# Patient Record
Sex: Male | Born: 1940 | Race: White | Hispanic: No | Marital: Married | Smoking: Former smoker
Health system: Southern US, Community
[De-identification: ages and names within clinical notes are randomized; demographics above are authoritative.]

## PROBLEM LIST (undated history)

## (undated) DIAGNOSIS — I10 Essential (primary) hypertension: Secondary | ICD-10-CM

## (undated) DIAGNOSIS — IMO0001 Reserved for inherently not codable concepts without codable children: Secondary | ICD-10-CM

## (undated) DIAGNOSIS — G459 Transient cerebral ischemic attack, unspecified: Secondary | ICD-10-CM

## (undated) DIAGNOSIS — I639 Cerebral infarction, unspecified: Secondary | ICD-10-CM

## (undated) DIAGNOSIS — Z950 Presence of cardiac pacemaker: Secondary | ICD-10-CM

## (undated) DIAGNOSIS — Z95810 Presence of automatic (implantable) cardiac defibrillator: Secondary | ICD-10-CM

## (undated) DIAGNOSIS — I509 Heart failure, unspecified: Secondary | ICD-10-CM

## (undated) DIAGNOSIS — K219 Gastro-esophageal reflux disease without esophagitis: Secondary | ICD-10-CM

## (undated) DIAGNOSIS — N189 Chronic kidney disease, unspecified: Secondary | ICD-10-CM

## (undated) DIAGNOSIS — E785 Hyperlipidemia, unspecified: Secondary | ICD-10-CM

## (undated) DIAGNOSIS — I82409 Acute embolism and thrombosis of unspecified deep veins of unspecified lower extremity: Secondary | ICD-10-CM

## (undated) DIAGNOSIS — J449 Chronic obstructive pulmonary disease, unspecified: Secondary | ICD-10-CM

## (undated) DIAGNOSIS — M109 Gout, unspecified: Secondary | ICD-10-CM

## (undated) DIAGNOSIS — I2510 Atherosclerotic heart disease of native coronary artery without angina pectoris: Secondary | ICD-10-CM

## (undated) DIAGNOSIS — I739 Peripheral vascular disease, unspecified: Secondary | ICD-10-CM

## (undated) DIAGNOSIS — G4730 Sleep apnea, unspecified: Secondary | ICD-10-CM

## (undated) DIAGNOSIS — Z9289 Personal history of other medical treatment: Secondary | ICD-10-CM

## (undated) HISTORY — PX: CORONARY ARTERY BYPASS GRAFT: SHX141

## (undated) HISTORY — PX: CHOLECYSTECTOMY: SHX55

## (undated) HISTORY — PX: COLONOSCOPY W/ POLYPECTOMY: SHX1380

## (undated) HISTORY — PX: BYPASS GRAFT: SHX909

## (undated) HISTORY — PX: ABDOMINAL AORTIC ANEURYSM REPAIR: SUR1152

---

## 2005-06-05 ENCOUNTER — Ambulatory Visit: Payer: Self-pay | Admitting: Cardiology

## 2006-11-30 ENCOUNTER — Ambulatory Visit: Payer: Self-pay | Admitting: Vascular Surgery

## 2008-05-24 ENCOUNTER — Inpatient Hospital Stay (HOSPITAL_COMMUNITY): Admission: EM | Admit: 2008-05-24 | Discharge: 2008-05-28 | Payer: Self-pay | Admitting: Emergency Medicine

## 2008-05-24 ENCOUNTER — Encounter (INDEPENDENT_AMBULATORY_CARE_PROVIDER_SITE_OTHER): Payer: Self-pay | Admitting: Neurology

## 2008-05-25 ENCOUNTER — Ambulatory Visit: Payer: Self-pay | Admitting: Hematology & Oncology

## 2009-03-03 ENCOUNTER — Inpatient Hospital Stay (HOSPITAL_COMMUNITY): Admission: EM | Admit: 2009-03-03 | Discharge: 2009-03-06 | Payer: Self-pay | Admitting: Emergency Medicine

## 2009-03-03 ENCOUNTER — Encounter: Payer: Self-pay | Admitting: Emergency Medicine

## 2009-03-05 ENCOUNTER — Encounter (INDEPENDENT_AMBULATORY_CARE_PROVIDER_SITE_OTHER): Payer: Self-pay | Admitting: Internal Medicine

## 2009-03-05 ENCOUNTER — Ambulatory Visit: Payer: Self-pay | Admitting: Vascular Surgery

## 2009-07-19 ENCOUNTER — Ambulatory Visit: Payer: Self-pay | Admitting: Cardiology

## 2009-07-19 ENCOUNTER — Inpatient Hospital Stay (HOSPITAL_COMMUNITY): Admission: EM | Admit: 2009-07-19 | Discharge: 2009-07-21 | Payer: Self-pay | Admitting: Emergency Medicine

## 2009-07-19 ENCOUNTER — Ambulatory Visit: Payer: Self-pay | Admitting: Pulmonary Disease

## 2009-07-20 ENCOUNTER — Encounter (INDEPENDENT_AMBULATORY_CARE_PROVIDER_SITE_OTHER): Payer: Self-pay | Admitting: Internal Medicine

## 2009-07-21 ENCOUNTER — Ambulatory Visit: Payer: Self-pay | Admitting: Vascular Surgery

## 2009-11-12 ENCOUNTER — Inpatient Hospital Stay (HOSPITAL_COMMUNITY): Admission: EM | Admit: 2009-11-12 | Discharge: 2009-11-13 | Payer: Self-pay | Admitting: Emergency Medicine

## 2010-04-13 ENCOUNTER — Encounter: Payer: Self-pay | Admitting: *Deleted

## 2010-06-06 LAB — CARDIAC PANEL(CRET KIN+CKTOT+MB+TROPI)
CK, MB: 1.9 ng/mL (ref 0.3–4.0)
Relative Index: INVALID (ref 0.0–2.5)
Relative Index: INVALID (ref 0.0–2.5)
Total CK: 30 U/L (ref 7–232)
Total CK: 30 U/L (ref 7–232)
Troponin I: 0.06 ng/mL (ref 0.00–0.06)

## 2010-06-06 LAB — CK TOTAL AND CKMB (NOT AT ARMC)
CK, MB: 2.8 ng/mL (ref 0.3–4.0)
Total CK: 43 U/L (ref 7–232)

## 2010-06-06 LAB — POCT I-STAT, CHEM 8
Calcium, Ion: 1.1 mmol/L — ABNORMAL LOW (ref 1.12–1.32)
Creatinine, Ser: 1.4 mg/dL (ref 0.4–1.5)
Glucose, Bld: 249 mg/dL — ABNORMAL HIGH (ref 70–99)
HCT: 46 % (ref 39.0–52.0)
Hemoglobin: 15.6 g/dL (ref 13.0–17.0)

## 2010-06-06 LAB — CBC
HCT: 43.7 % (ref 39.0–52.0)
MCH: 29.4 pg (ref 26.0–34.0)
MCH: 29.6 pg (ref 26.0–34.0)
MCHC: 34.3 g/dL (ref 30.0–36.0)
MCV: 85.2 fL (ref 78.0–100.0)
MCV: 86.2 fL (ref 78.0–100.0)
Platelets: 159 10*3/uL (ref 150–400)
Platelets: 162 10*3/uL (ref 150–400)
RBC: 4.94 MIL/uL (ref 4.22–5.81)
RDW: 13.4 % (ref 11.5–15.5)
WBC: 11 10*3/uL — ABNORMAL HIGH (ref 4.0–10.5)

## 2010-06-06 LAB — LIPID PANEL
HDL: 30 mg/dL — ABNORMAL LOW (ref >39)
LDL Cholesterol: 86 mg/dL (ref 0–99)
Triglycerides: 194 mg/dL — ABNORMAL HIGH (ref <150)

## 2010-06-06 LAB — GLUCOSE, CAPILLARY
Glucose-Capillary: 285 mg/dL — ABNORMAL HIGH (ref 70–99)
Glucose-Capillary: 570 mg/dL (ref 70–99)
Glucose-Capillary: 574 mg/dL (ref 70–99)

## 2010-06-06 LAB — DIFFERENTIAL
Eosinophils Absolute: 0.1 10*3/uL (ref 0.0–0.7)
Lymphocytes Relative: 8 % — ABNORMAL LOW (ref 12–46)
Neutro Abs: 10.7 10*3/uL — ABNORMAL HIGH (ref 1.7–7.7)
Neutrophils Relative %: 88 % — ABNORMAL HIGH (ref 43–77)

## 2010-06-06 LAB — TSH
TSH: 0.345 u[IU]/mL — ABNORMAL LOW (ref 0.350–4.500)
TSH: 0.55 u[IU]/mL (ref 0.350–4.500)

## 2010-06-06 LAB — HEMOGLOBIN A1C
Hgb A1c MFr Bld: 9.7 % — ABNORMAL HIGH (ref <5.7)
Hgb A1c MFr Bld: 9.9 % — ABNORMAL HIGH (ref <5.7)

## 2010-06-06 LAB — MRSA PCR SCREENING: MRSA by PCR: POSITIVE — AB

## 2010-06-06 LAB — MAGNESIUM: Magnesium: 2.3 mg/dL (ref 1.5–2.5)

## 2010-06-06 LAB — PHOSPHORUS
Phosphorus: 3.8 mg/dL (ref 2.3–4.6)
Phosphorus: 4.1 mg/dL (ref 2.3–4.6)

## 2010-06-06 LAB — APTT: aPTT: 41 seconds — ABNORMAL HIGH (ref 24–37)

## 2010-06-06 LAB — PROTIME-INR: Prothrombin Time: 28.5 seconds — ABNORMAL HIGH (ref 11.6–15.2)

## 2010-06-10 LAB — GLUCOSE, CAPILLARY
Glucose-Capillary: 254 mg/dL — ABNORMAL HIGH (ref 70–99)
Glucose-Capillary: 262 mg/dL — ABNORMAL HIGH (ref 70–99)
Glucose-Capillary: 391 mg/dL — ABNORMAL HIGH (ref 70–99)
Glucose-Capillary: 517 mg/dL — ABNORMAL HIGH (ref 70–99)

## 2010-06-10 LAB — URINALYSIS, MICROSCOPIC ONLY
Bilirubin Urine: NEGATIVE
Ketones, ur: NEGATIVE mg/dL
Specific Gravity, Urine: 1.016 (ref 1.005–1.030)
Urobilinogen, UA: 0.2 mg/dL (ref 0.0–1.0)
pH: 5 (ref 5.0–8.0)

## 2010-06-10 LAB — CARDIAC PANEL(CRET KIN+CKTOT+MB+TROPI)
CK, MB: 1.5 ng/mL (ref 0.3–4.0)
CK, MB: 3.1 ng/mL (ref 0.3–4.0)
CK, MB: 5.4 ng/mL — ABNORMAL HIGH (ref 0.3–4.0)
Relative Index: INVALID (ref 0.0–2.5)
Relative Index: INVALID (ref 0.0–2.5)
Relative Index: INVALID (ref 0.0–2.5)
Total CK: 60 U/L (ref 7–232)
Total CK: 47 U/L (ref 7–232)
Total CK: 49 U/L (ref 7–232)
Troponin I: 0.43 ng/mL — ABNORMAL HIGH (ref 0.00–0.06)

## 2010-06-10 LAB — CBC
HCT: 42.8 % (ref 39.0–52.0)
Hemoglobin: 13.6 g/dL (ref 13.0–17.0)
Hemoglobin: 15.2 g/dL (ref 13.0–17.0)
MCHC: 35.4 g/dL (ref 30.0–36.0)
MCV: 93.5 fL (ref 78.0–100.0)
RBC: 4.22 MIL/uL (ref 4.22–5.81)
RBC: 4.58 MIL/uL (ref 4.22–5.81)
RDW: 14.2 % (ref 11.5–15.5)

## 2010-06-10 LAB — TROPONIN I: Troponin I: 0.04 ng/mL (ref 0.00–0.06)

## 2010-06-10 LAB — BLOOD GAS, ARTERIAL
Acid-base deficit: 1.9 mmol/L (ref 0.0–2.0)
Acid-base deficit: 2.4 mmol/L — ABNORMAL HIGH (ref 0.0–2.0)
Bicarbonate: 22.6 mEq/L (ref 20.0–24.0)
O2 Saturation: 97.8 %
O2 Saturation: 98.9 %
Patient temperature: 98.6
Patient temperature: 98.6
TCO2: 22.5 mmol/L (ref 0–100)

## 2010-06-10 LAB — COMPREHENSIVE METABOLIC PANEL
ALT: 14 U/L (ref 0–53)
AST: 26 U/L (ref 0–37)
Alkaline Phosphatase: 53 U/L (ref 39–117)
BUN: 13 mg/dL (ref 6–23)
CO2: 26 mEq/L (ref 19–32)
Calcium: 8.4 mg/dL (ref 8.4–10.5)
Chloride: 98 mEq/L (ref 96–112)
GFR calc Af Amer: 59 mL/min — ABNORMAL LOW (ref >60)
GFR calc non Af Amer: 49 mL/min — ABNORMAL LOW (ref >60)
Glucose, Bld: 244 mg/dL — ABNORMAL HIGH (ref 70–99)
Glucose, Bld: 451 mg/dL — ABNORMAL HIGH (ref 70–99)
Potassium: 3.8 mEq/L (ref 3.5–5.1)
Sodium: 134 mEq/L — ABNORMAL LOW (ref 135–145)
Total Bilirubin: 1.7 mg/dL — ABNORMAL HIGH (ref 0.3–1.2)
Total Protein: 5.9 g/dL — ABNORMAL LOW (ref 6.0–8.3)

## 2010-06-10 LAB — PROTIME-INR
INR: 0.98 (ref 0.00–1.49)
INR: 1.06 (ref 0.00–1.49)
Prothrombin Time: 12.9 seconds (ref 11.6–15.2)
Prothrombin Time: 13.7 seconds (ref 11.6–15.2)

## 2010-06-10 LAB — CULTURE, BLOOD (ROUTINE X 2)

## 2010-06-10 LAB — HEPARIN LEVEL (UNFRACTIONATED): Heparin Unfractionated: 0.13 IU/mL — ABNORMAL LOW (ref 0.30–0.70)

## 2010-06-10 LAB — CK TOTAL AND CKMB (NOT AT ARMC)
Relative Index: INVALID (ref 0.0–2.5)
Relative Index: INVALID (ref 0.0–2.5)
Total CK: 61 U/L (ref 7–232)

## 2010-06-10 LAB — HEMOGLOBIN A1C
Hgb A1c MFr Bld: 14.1 % — ABNORMAL HIGH (ref <5.7)
Mean Plasma Glucose: 358 mg/dL — ABNORMAL HIGH (ref <117)

## 2010-06-10 LAB — LIPID PANEL: Triglycerides: 222 mg/dL — ABNORMAL HIGH (ref <150)

## 2010-06-10 LAB — DIFFERENTIAL
Basophils Relative: 0 % (ref 0–1)
Eosinophils Absolute: 0.2 10*3/uL (ref 0.0–0.7)
Eosinophils Relative: 3 % (ref 0–5)
Neutrophils Relative %: 67 % (ref 43–77)

## 2010-06-10 LAB — MAGNESIUM: Magnesium: 1.9 mg/dL (ref 1.5–2.5)

## 2010-06-10 LAB — TSH
TSH: 0.651 u[IU]/mL (ref 0.350–4.500)
TSH: 0.859 u[IU]/mL (ref 0.350–4.500)

## 2010-06-10 LAB — PHOSPHORUS: Phosphorus: 2.8 mg/dL (ref 2.3–4.6)

## 2010-06-10 LAB — MRSA PCR SCREENING

## 2010-06-10 LAB — LACTIC ACID, PLASMA: Lactic Acid, Venous: 1.1 mmol/L (ref 0.5–2.2)

## 2010-06-24 LAB — GLUCOSE, CAPILLARY
Glucose-Capillary: 179 mg/dL — ABNORMAL HIGH (ref 70–99)
Glucose-Capillary: 191 mg/dL — ABNORMAL HIGH (ref 70–99)
Glucose-Capillary: 227 mg/dL — ABNORMAL HIGH (ref 70–99)
Glucose-Capillary: 276 mg/dL — ABNORMAL HIGH (ref 70–99)
Glucose-Capillary: 281 mg/dL — ABNORMAL HIGH (ref 70–99)

## 2010-06-24 LAB — LIPID PANEL
Cholesterol: 118 mg/dL (ref 0–200)
LDL Cholesterol: 45 mg/dL (ref 0–99)
Total CHOL/HDL Ratio: 3.9 RATIO
Triglycerides: 215 mg/dL — ABNORMAL HIGH (ref <150)
VLDL: 43 mg/dL — ABNORMAL HIGH (ref 0–40)

## 2010-06-24 LAB — PROTIME-INR
INR: 1.24 (ref 0.00–1.49)
INR: 1.7 — ABNORMAL HIGH (ref 0.00–1.49)
Prothrombin Time: 15.5 seconds — ABNORMAL HIGH (ref 11.6–15.2)
Prothrombin Time: 19.8 seconds — ABNORMAL HIGH (ref 11.6–15.2)

## 2010-06-24 LAB — RAPID URINE DRUG SCREEN, HOSP PERFORMED
Barbiturates: NOT DETECTED
Cocaine: NOT DETECTED
Opiates: POSITIVE — AB
Tetrahydrocannabinol: NOT DETECTED

## 2010-06-24 LAB — POCT CARDIAC MARKERS
CKMB, poc: 1.5 ng/mL (ref 1.0–8.0)
CKMB, poc: 1.6 ng/mL (ref 1.0–8.0)
Troponin i, poc: <0.05 ng/mL (ref 0.00–0.09)

## 2010-06-24 LAB — CBC
HCT: 50.5 % (ref 39.0–52.0)
Hemoglobin: 16.7 g/dL (ref 13.0–17.0)
Hemoglobin: 17 g/dL (ref 13.0–17.0)
Hemoglobin: 17.2 g/dL — ABNORMAL HIGH (ref 13.0–17.0)
MCHC: 34 g/dL (ref 30.0–36.0)
MCHC: 34.1 g/dL (ref 30.0–36.0)
MCHC: 34.5 g/dL (ref 30.0–36.0)
MCV: 90.4 fL (ref 78.0–100.0)
MCV: 90.9 fL (ref 78.0–100.0)
Platelets: 68 10*3/uL — ABNORMAL LOW (ref 150–400)
RBC: 5.41 MIL/uL (ref 4.22–5.81)
RBC: 5.59 MIL/uL (ref 4.22–5.81)
RDW: 14.9 % (ref 11.5–15.5)
RDW: 15.2 % (ref 11.5–15.5)
WBC: 7.6 10*3/uL (ref 4.0–10.5)

## 2010-06-24 LAB — POCT I-STAT, CHEM 8
Glucose, Bld: 273 mg/dL — ABNORMAL HIGH (ref 70–99)
HCT: 50 % (ref 39.0–52.0)
Hemoglobin: 17 g/dL (ref 13.0–17.0)
Potassium: 4.1 mEq/L (ref 3.5–5.1)
Sodium: 140 mEq/L (ref 135–145)
TCO2: 23 mmol/L (ref 0–100)

## 2010-06-24 LAB — BASIC METABOLIC PANEL
BUN: 15 mg/dL (ref 6–23)
CO2: 23 mEq/L (ref 19–32)
CO2: 27 mEq/L (ref 19–32)
Calcium: 8.1 mg/dL — ABNORMAL LOW (ref 8.4–10.5)
Calcium: 8.7 mg/dL (ref 8.4–10.5)
Chloride: 104 mEq/L (ref 96–112)
Creatinine, Ser: 1.18 mg/dL (ref 0.4–1.5)
Creatinine, Ser: 1.27 mg/dL (ref 0.4–1.5)
GFR calc non Af Amer: 56 mL/min — ABNORMAL LOW (ref >60)
Glucose, Bld: 186 mg/dL — ABNORMAL HIGH (ref 70–99)
Glucose, Bld: 224 mg/dL — ABNORMAL HIGH (ref 70–99)

## 2010-06-24 LAB — DIFFERENTIAL
Basophils Absolute: 0 10*3/uL (ref 0.0–0.1)
Basophils Relative: 1 % (ref 0–1)
Eosinophils Absolute: 0.2 10*3/uL (ref 0.0–0.7)
Eosinophils Relative: 3 % (ref 0–5)
Monocytes Absolute: 0.6 10*3/uL (ref 0.1–1.0)
Monocytes Relative: 7 % (ref 3–12)

## 2010-06-24 LAB — CARDIAC PANEL(CRET KIN+CKTOT+MB+TROPI)
CK, MB: 2.4 ng/mL (ref 0.3–4.0)
CK, MB: 2.5 ng/mL (ref 0.3–4.0)
Relative Index: INVALID (ref 0.0–2.5)
Relative Index: INVALID (ref 0.0–2.5)
Relative Index: INVALID (ref 0.0–2.5)
Total CK: 72 U/L (ref 7–232)
Total CK: 93 U/L (ref 7–232)

## 2010-06-24 LAB — HEMOGLOBIN A1C
Hgb A1c MFr Bld: 10.6 % — ABNORMAL HIGH (ref 4.6–6.1)
Mean Plasma Glucose: 258 mg/dL

## 2010-06-24 LAB — TSH: TSH: 2.936 u[IU]/mL (ref 0.350–4.500)

## 2010-06-24 LAB — D-DIMER, QUANTITATIVE: D-Dimer, Quant: 1.47 ug/mL-FEU — ABNORMAL HIGH (ref 0.00–0.48)

## 2010-07-03 LAB — GLUCOSE, CAPILLARY
Glucose-Capillary: 113 mg/dL — ABNORMAL HIGH (ref 70–99)
Glucose-Capillary: 119 mg/dL — ABNORMAL HIGH (ref 70–99)
Glucose-Capillary: 130 mg/dL — ABNORMAL HIGH (ref 70–99)
Glucose-Capillary: 132 mg/dL — ABNORMAL HIGH (ref 70–99)
Glucose-Capillary: 141 mg/dL — ABNORMAL HIGH (ref 70–99)
Glucose-Capillary: 166 mg/dL — ABNORMAL HIGH (ref 70–99)
Glucose-Capillary: 176 mg/dL — ABNORMAL HIGH (ref 70–99)
Glucose-Capillary: 180 mg/dL — ABNORMAL HIGH (ref 70–99)
Glucose-Capillary: 180 mg/dL — ABNORMAL HIGH (ref 70–99)
Glucose-Capillary: 215 mg/dL — ABNORMAL HIGH (ref 70–99)

## 2010-07-03 LAB — PROTIME-INR
INR: 1 (ref 0.00–1.49)
INR: 1.1 (ref 0.00–1.49)
INR: 1.1 (ref 0.00–1.49)
Prothrombin Time: 13.1 seconds (ref 11.6–15.2)
Prothrombin Time: 13.9 seconds (ref 11.6–15.2)
Prothrombin Time: 14.4 seconds (ref 11.6–15.2)

## 2010-07-03 LAB — CBC
HCT: 42 % (ref 39.0–52.0)
HCT: 51.3 % (ref 39.0–52.0)
Hemoglobin: 16.2 g/dL (ref 13.0–17.0)
Hemoglobin: 17.9 g/dL — ABNORMAL HIGH (ref 13.0–17.0)
MCHC: 34.7 g/dL (ref 30.0–36.0)
MCHC: 34.9 g/dL (ref 30.0–36.0)
MCHC: 35.5 g/dL (ref 30.0–36.0)
MCHC: 35.6 g/dL (ref 30.0–36.0)
MCV: 91.6 fL (ref 78.0–100.0)
MCV: 91.7 fL (ref 78.0–100.0)
MCV: 92.3 fL (ref 78.0–100.0)
MCV: 92.6 fL (ref 78.0–100.0)
Platelets: 71 10*3/uL — ABNORMAL LOW (ref 150–400)
Platelets: 82 10*3/uL — ABNORMAL LOW (ref 150–400)
Platelets: 92 10*3/uL — ABNORMAL LOW (ref 150–400)
Platelets: 98 10*3/uL — ABNORMAL LOW (ref 150–400)
RBC: 4.68 MIL/uL (ref 4.22–5.81)
RBC: 5.08 MIL/uL (ref 4.22–5.81)
RBC: 5.54 MIL/uL (ref 4.22–5.81)
RDW: 13.7 % (ref 11.5–15.5)
RDW: 13.7 % (ref 11.5–15.5)
RDW: 14.1 % (ref 11.5–15.5)
WBC: 8.1 10*3/uL (ref 4.0–10.5)
WBC: 8.9 10*3/uL (ref 4.0–10.5)
WBC: 9.6 10*3/uL (ref 4.0–10.5)

## 2010-07-03 LAB — CARDIAC PANEL(CRET KIN+CKTOT+MB+TROPI)
CK, MB: 1.9 ng/mL (ref 0.3–4.0)
Relative Index: INVALID (ref 0.0–2.5)
Troponin I: 0.01 ng/mL (ref 0.00–0.06)

## 2010-07-03 LAB — TYPE AND SCREEN
ABO/RH(D): A POS
Antibody Screen: NEGATIVE

## 2010-07-03 LAB — DIC (DISSEMINATED INTRAVASCULAR COAGULATION)PANEL
D-Dimer, Quant: 5.52 ug/mL-FEU — ABNORMAL HIGH (ref 0.00–0.48)
Platelets: 109 10*3/uL — ABNORMAL LOW (ref 150–400)
Prothrombin Time: 14.4 seconds (ref 11.6–15.2)
aPTT: 39 seconds — ABNORMAL HIGH (ref 24–37)

## 2010-07-03 LAB — PROTHROMBIN GENE MUTATION

## 2010-07-03 LAB — TROPONIN I: Troponin I: 0.02 ng/mL (ref 0.00–0.06)

## 2010-07-03 LAB — LUPUS ANTICOAGULANT PANEL
PTTLA 4:1 Mix: 52.7 secs — ABNORMAL HIGH (ref 36.3–48.8)
PTTLA Confirmation: 7.9 secs (ref <8.0)

## 2010-07-03 LAB — PREPARE PLATELETS

## 2010-07-03 LAB — FACTOR 5 LEIDEN

## 2010-07-03 LAB — PREPARE FRESH FROZEN PLASMA

## 2010-07-03 LAB — COMPREHENSIVE METABOLIC PANEL
Alkaline Phosphatase: 53 U/L (ref 39–117)
BUN: 19 mg/dL (ref 6–23)
Chloride: 103 mEq/L (ref 96–112)
Creatinine, Ser: 1.42 mg/dL (ref 0.4–1.5)
Glucose, Bld: 159 mg/dL — ABNORMAL HIGH (ref 70–99)
Potassium: 3.2 mEq/L — ABNORMAL LOW (ref 3.5–5.1)
Total Bilirubin: 1.5 mg/dL — ABNORMAL HIGH (ref 0.3–1.2)
Total Protein: 7 g/dL (ref 6.0–8.3)

## 2010-07-03 LAB — DIFFERENTIAL
Basophils Absolute: 0.1 10*3/uL (ref 0.0–0.1)
Basophils Relative: 1 % (ref 0–1)
Neutro Abs: 5.7 10*3/uL (ref 1.7–7.7)
Neutrophils Relative %: 59 % (ref 43–77)

## 2010-07-03 LAB — BETA-2-GLYCOPROTEIN I ABS, IGG/M/A
Beta-2 Glyco I IgG: <4 U/mL (ref <20)
Beta-2-Glycoprotein I IgA: <4 U/mL (ref <10)
Beta-2-Glycoprotein I IgM: <4 U/mL (ref <10)

## 2010-07-03 LAB — HEPARIN INDUCED THROMBOCYTOPENIA PNL: Patient O.D.: 0.138

## 2010-07-03 LAB — HEPARIN LEVEL (UNFRACTIONATED)
Heparin Unfractionated: 0.4 IU/mL (ref 0.30–0.70)
Heparin Unfractionated: <0.1 IU/mL — ABNORMAL LOW (ref 0.30–0.70)

## 2010-07-03 LAB — CK TOTAL AND CKMB (NOT AT ARMC)
CK, MB: 2.2 ng/mL (ref 0.3–4.0)
Relative Index: 2 (ref 0.0–2.5)
Total CK: 111 U/L (ref 7–232)

## 2010-07-03 LAB — PROTEIN S, TOTAL: Protein S Ag, Total: 117 % (ref 70–140)

## 2010-07-03 LAB — LIPID PANEL
HDL: 26 mg/dL — ABNORMAL LOW (ref >39)
Triglycerides: 167 mg/dL — ABNORMAL HIGH (ref <150)
VLDL: 33 mg/dL (ref 0–40)

## 2010-07-03 LAB — BASIC METABOLIC PANEL
CO2: 23 mEq/L (ref 19–32)
Calcium: 8.7 mg/dL (ref 8.4–10.5)
Creatinine, Ser: 1.09 mg/dL (ref 0.4–1.5)
Glucose, Bld: 127 mg/dL — ABNORMAL HIGH (ref 70–99)

## 2010-07-03 LAB — PROTEIN C ACTIVITY: Protein C Activity: 137 % — ABNORMAL HIGH (ref 75–133)

## 2010-07-03 LAB — CARDIOLIPIN ANTIBODIES, IGG, IGM, IGA: Anticardiolipin IgG: <7 [GPL'U] — ABNORMAL LOW (ref <11)

## 2010-07-03 LAB — PROTEIN S ACTIVITY: Protein S Activity: 99 % (ref 69–129)

## 2010-07-03 LAB — HOMOCYSTEINE: Homocysteine: 10.6 umol/L (ref 4.0–15.4)

## 2010-07-03 LAB — APTT: aPTT: 31 seconds (ref 24–37)

## 2010-07-03 LAB — ANTITHROMBIN III: AntiThromb III Func: 245 % — ABNORMAL HIGH (ref 76–126)

## 2010-08-05 NOTE — Consult Note (Signed)
VASCULAR SURGERY CONSULTATION   [name], [name]  DOB:  [birth year]                                       11/30/2006  CHART#:[id number]   I saw this patient in the office today in consultation concerning some  leg pain.  He was referred by Dr. Charm Barges.  This is a pleasant 70-year-  old gentleman who states that for years he has been having aching pain  in both lower extremities.  This appears to be exacerbated by ambulation  and is more significant on the left side.  It involves his calves,  thighs and hips.  He also experiences these symptoms simply with  standing.  I did not get any clear cut history of rest pain and no  history of non healing ulcers.  He has had no upper extremity symptoms  that he is aware of.   PAST MEDICAL HISTORY:  Significant for adult onset diabetes,  hypertension, hypercholesterolemia and coronary artery disease.  He has  had three previous myocardial infarctions. The initial MI was in 1985  and his most recent MI was in 2001.  He also has a history of congestive  heart failure.  He initially had a pacemaker placed in 1992 but I  believe had an AICD then placed in 1999.  He underwent coronary  revascularization in 1985.  I think most of this work has been done at  [facility].   In addition the patient has a history of mini strokes in the past,  most recently four months ago.  This was associated with some right  sided weakness.  He does tell me that the has had a carotid duplex scan  but does not remember exactly when this was done.   FAMILY HISTORY:  His father died from a heart attack at age 40.  His  father died with leukemia at age 1.  He has a brother who died at age  56 with an MI.   SOCIAL HISTORY:  He is married and has two children.  He quit tobacco in  1985.   REVIEW OF SYSTEMS:  Are documented on the medical history form in his  chart.   MEDICATIONS:  Are documented on the medical history form in his chart.   PHYSICAL EXAMINATION:  Blood pressure 126/81, heart rate 78.  I do not  detect any carotid bruits.  Lungs are clear bilaterally to auscultation.  On cardiac exam, he has a regular rate and rhythm.  Abdomen is soft and  nontender. I could not palpate an aneurysm.  He has a palpable right  radial pulse.  He has had left radial artery harvested for his heart  surgery.  He does have a normal brachial pulse on the left.  He has  palpable femoral, popliteal, dorsalis pedis and posterior tibial pulses  bilaterally.  He has significant varicose veins of both lower  extremities.   I reassured him that I think he has excellent arterial flow and I do not  think his leg symptoms can be attributed to peripheral vascular disease.  He has palpable pulses in both feet.  Likewise, although he does have  evidence of venous disease on exam, his pain appears to decrease with  standing and this would not fit with venous disease.  He may also have  some problems with restless leg syndrome and I  have discussed the only  medication I am aware for this is Requip which he will discuss with Dr.  Charm Barges.  I have encouraged him to stay as active as possible and I will  be happy to see him back at any time if any new vascular problems arise.  Certainly if his carotid duplex scan showed any significant carotid  disease, I would be happy to follow this, but we do not have that  report, and he is not sure when exactly this was done.   Di Kindle. Edilia Bo, M.D.  Electronically Signed  CSD/MEDQ  D:  11/30/2006  T:  12/01/2006  Job:  [id number]   cc:   Samuel Jester

## 2010-08-05 NOTE — H&P (Signed)
NAMEROWEN, WILMER NO.:  [id number]   MEDICAL RECORD NO.:  [id number]          PATIENT TYPE:  INP   LOCATION:  3113                         FACILITY:  [facility]   PHYSICIAN:  Melvyn Novas, M.D.  DATE OF BIRTH:  [birth year]   DATE OF ADMISSION:  05/24/2008  DATE OF DISCHARGE:                              HISTORY & PHYSICAL   [name] is the patient not known to the [facility]  [facility].  He received his care at the [facility]  with Dr. Dale Wau[place]a with a not named cardiologist at [facility] and has seen Dr. Alphonzo Dublin, Neurology at  [facility].  We were able to get some  past medical history through acquaintances of the patient that followed  the EMS.  Their description is as follows; at 1 a.m., the patient was still  sitting with friends at their house in [place].  The friends are caretakers to his wife who was severely handicapped  after suffering a traumatic brain injury in a car accident.  His wife is  out of town.  The 2 children live further away, 1 in [place] and a  daughter out of state.  At 1:15 approximately, he left his friend's  house, drove home.  At 1:45, he called his friend with slurred speech  and stated on the phone I think I have a stroke now.  He also called a  neighbour and 911.    The patient was brought by EMS who called a code stroke.  They stated  they picked him up at the house at about 1:45, so pretty much the time  that he also called his friend.  On arrival here at the ER at 2:20, his  acquaintances stated to the EMS that he has neurologic care at [facility] and at the [facility] yet the EMS insisted that [facility] would be the next [facility]roke [facility] and that they have to go to this  place.  Neither of the EMS techs that brought the patient here could give an  exact onset time.  We learnt the details of above  history once his  friend arrived here by car privately, which would have been around 3:30  a.m. and I had over the phone talked to Dr. Rosalia Hammers that I recommended to  counsel the code stroke as we learned from EMS that there was no defined  onset time and another member of the EMS crew gave a completely  different history from his partner.  It was very difficult to find out  the truth here.  The patient's history of present illness seems now to be verified and  his friend, again caretakers of his wife normally have a timestamp of  1:38 on their mobile phone.  This is when he called stating that he felt funny and that they need to  make sure he comes to the hospital.  Here upon arrival, he is mute and  shows dense left-sided weakness, but no upgoing toe yet.  He has  full  extraocular movements.  He nods to commands.  He can follow motor  commands with his unaffected right side, which is his dominant side.  His blood pressure is 135/80, his heart rate is 70, and respiratory rate  is 14.  He has multiple stroke risk factors.  The patient has a defibrillator and pacemaker implant and he is followed  by a cardiologist at [facility], but his friends do not know the name.  Also that he had multiple strokes in the past, but as far as they no  that these have been remote about 5-2 years ago.  The patient has a long  history of coronary artery disease and had a stent placed.  He has diabetes.  He has history of myocardial infarction, pacemaker  defibrillator, and supposedly strokes.  Also, the CT here showed no  abnormality upon arrival.   He is taking the following medication; gabapentin, Protonix, calcium,  Lasix, allopurinol, carvedilol, omega fish oil, Plavix, ezetimibe,  medication I do not know, simvastatin, temazepam at night, Levemir, and  NovoLog insulin.   He has no known drug allergies.   He has also peripheral vascular disease.   Lab results show an INR of 1, white blood cell count  of 9.6, H and H of  17.9 over 51.3.  At 4:20, we received the platelet count after we  converted the patient back to a code stroke and decided because of his  high grade and sudden impairment to suspect a large vessel occlusion to  give him only two- thirds of the t-PA dose and call Dr. Corliss Skains for an  intervention, platelet count was 82,000.  We gave frozen platelets to bring the count to 200,000, yet gave in the  t-PA at the same time.  The patient had on CT, remote lacunar infarct in  the left caudate.  On chest x-ray, cardiomegaly and mild intestinal  edema and he points this painful grimacing to his right chest.  Cardiac  enzymes returned normal.  His EKG shows a paced rhythm.  No ST  elevation.  NIH stroke scale is below 20 at 17.   GENERAL:  The patient is at baseline, not disabled or and be considered  independently able to live.  HEENT:  Head and face are traumatic with a facial droop.  Eyes show  equal response to light and full extraocular movements are coordinated,  conjugate.  NECK:  Supple.  HEART:  The patient was on arrival described as tachypneic, but now is  no longer tachypneic, again he has left-sided chest tenderness.  ABDOMEN:  Soft, normal bowel sounds.  PERIPHERAL VASCULAR STATUS:  Slightly dystrophic skin in both lower  extremities and he has had a vein harvested probably for open heart  bypass surgery and treatment of his coronary artery disease.  He has no  bruising or physical sign of injury.   Stroke risk factors for this gentleman include peripheral vascular  disease, hypertension, history of diabetes, prior stroke, obesity,  hyperlipidemia, and cardiac arrhythmia.  Again, Dr. Alphonzo Dublin at [facility] has followed him.  Dr. Dale [place] with [facility] is his current  neurologist, Dr. Samuel Jester in [place] is his primary care  physician.  Family history could not be obtained.  The people currently with the  patient at the bedside  do not know the details of his family history.  They state that the couple has 2 healthy children as far as they know  and again that the  wife of this patient has been paralyzed with some  mental status changes, cognitive disability since a motor vehicle  accident.  The patient is described as a nonsmoker and nondrinker.  After  evaluating the patient, discussing the results with his son over the  phone, he was on his way from [place].  We decided to give two-thirds  of t-PA.  The son gave his verbal permission to have an angiography by  catheter done and a possible clot retrieval should this be indicated.  He also permitted [place] to give 2 packs of frozen platelets.  Dr. Corliss Skains  has been called.  I tried to communicate with the patient's wife who  appeared confused and very anxious on the phone and will not be able to  return to this area since [place].  I have the phone conversation witnessed by one of the wife's caretakers,  her name is Algis Downs, the patient's son is Shylo Dillenbeck,  he goes by [employer] and we advised him of the risks and benefits of a  possible angiography and possible clot retrieval over the speaker phone  witnessed by Ms. Algis Downs, he gave permission.  He understood and indicated that he would like to have any possible  steps done that would limit his father's significant disability that  would be expected from not intervening at this point since his father is  also the caretaker for his mother.  He understood that there could be  complications such as bleed, intracerebral hemorrhage, infection, or  endothelial damage.  He also understood that the treatment may not lead  to the desired results and that his father may remain hemiparetic and  huge.   I documented the NIH stroke scale and co-signed the rapid response  nurse's assessments.  I have spent more than 1 hour in critical care  time already with  this patient and I am preparing now for a prolonged  family conference by phone as soon as the son arrived from [place].  The score of 17 is based on partial neglect, severe mute dysarthria,  possible partial loss of sensory, also this was difficult to establish.  The patient nodded that he would feel left and right side to pinprick,  temperature, and vibration.  Limb ataxia could not be evaluated.  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  No clear visual impairment,  conjugate gaze, follows examiner's fingers from left to right without  gait preference or based motor commands with the unaffected side not  adequately to questions and would be considered alert.  EKG shows a  paced rhythm.  The patient received based on his body weight of 86 kg,  52 mg of t-PA, 7.8 in a bolus, 44.2 will be given over the period of the  next 30 minutes.  Dr. Cathey Endow, pharmacist has helped this with his  request.  Dr. Boston Service treated the patient  in the ER.  Rapid response was first at bedside and evaluated the  patient, we are preparing now for the catheter angiogram and possible  clot retrieval if a large vessel occlusion is found.  The patient will  be admitted to 3100 after the procedure.      Melvyn Novas, M.D.  Electronically Signed     CD/MEDQ  D:  05/24/2008  T:  05/24/2008  Job:  [id number]

## 2010-08-05 NOTE — Discharge Summary (Signed)
NAMEJAQUARIOUS, [name]              ACCOUNT NO.:  [id number]   MEDICAL RECORD NO.:  [id number]          PATIENT TYPE:  INP   LOCATION:  3007                         FACILITY:  [facility]   PHYSICIAN:  Pramod P. Pearlean Brownie, MD    DATE OF BIRTH:  [birth year]   DATE OF ADMISSION:  05/24/2008  DATE OF DISCHARGE:  05/28/2008                               DISCHARGE SUMMARY   DISCHARGE DIAGNOSES:  1. Left brain transient ischemic attack versus small infarct, not seen      on CT, status post two-third dose IV t-PA and negative angiogram.  2. Newly diagnosed thrombocytopenia.  3. Coronary artery disease with history of stent.  4. Diabetes.  5. History of myocardial infarction.  6. Pacemaker/defibrillator.  7. Previous strokes.  8. Peripheral vascular disease.  9. Obesity.  10.Hyperlipidemia.  11.Unknown cardiac arrhythmia.  12.Peripheral neuropathy.  13.History of blood clots in his leg after exposure to nuclear      activity in the [employer], took Coumadin for years.  14.Questionable gout.  15.Abdominal aortic aneurysm repair and graft.  16.Cholecystectomy.  17.CABG x3 on November 08, 1996, with a redo in 2002 at [facility].   DISCHARGE MEDICINES:  1. Neurontin 300 mg 2 times a day.  2. Prilosec 20 mg 2 times a day.  3. Calcium 600 mg 2 tabs a day.  4. Lasix 40 mg daily.  5. Allopurinol 100 mg daily.  6. Carvedilol 12.5 mg 1/2 tablet b.i.d.  7. Omega-3 52 caps b.i.d.  8. Vytorin 10/80 mg nightly.  9. Temazepam 50 mg nightly p.r.n.  10.Levemir insulin unknown dose daily and b.i.d. p.r.n.  CBGs.  11.NovoLog 40 units subcu daily (the patient unsure of actual dose.   STUDIES PERFORMED:  1. CT of the brain on admission shows no acute abnormality; old      lacune, left caudate.  2. Chest x-ray shows cardiomegaly and mild interstitial edema.  3. Cerebral angiogram shows no evidence of intraluminal filling      defects, occlusions, dissections, or aneurysms.  There are calcific   atherosclerotic disease involving the carotid bifurcations,      slightly worse on the right, moderate stenosis of the right      external carotid artery at the bulb.  4. CT of the head 24 hours post t-PA shows no acute abnormality.      There is still a remote lacune in the left head of the caudate.  5. A 2-D echocardiogram shows EF of 25% with akinesis of the septal      wall, dyskinesis of the periapical wall, akinesis of the      anterolateral wall, moderate hypokinesis of the inferior wall.      There was appearance of catheter pacing wire in the right      ventricle.  Estimated peak pulmonary artery systolic pressure was      27.  There was a thrombus along the inferoapical wall of the left      ventricle.  EKG shows AV dual-paced rhythm.  No further rhythm      analysis attempted  due to paced rhythm.  There is a right bundle-      branch block.   DISCHARGE LABORATORY STUDIES:  PTT 50, PT 14.0, and INR 1.1.  Heparin  level was 0.4.  Homocystine 10.6.  Rest of hypercoagulable panel  pending.  A heparin antibody screen was positive.  DIC panel showed  platelets 105, protime 14.4, INR 1.1, PTT 39, fibrinogen 490, and D-  dimer 5.52.  Hemoglobin A1c is 8.6.  Chemistry with glucose 127,  otherwise normal.  Cholesterol 141, triglycerides 167, HDL 26, and LDL  82.  Cardiac enzymes negative x3.  Blood type A+.   HISTORY OF PRESENT ILLNESS:  [name] is a 70 year old right-  handed Caucasian male who has a history of hypertension, hyperlipidemia,  and coronary artery disease.  He presented to [facility] at  2:20 a.m. via EMS with slurred speech and likely stroke symptoms.  The  patient was last seen normal at 1:15.  At that time, he was at his  friend's house and left driving home.  Thirty minutes later, he called  his friend with slurred speech and stated he thought he had a stroke.  His neighbor called 911.  EMS was called and picked him and brought him  into the  hospital.  The patient has a defibrillator pacemaker.  CT of  the head showed no acute abnormality and after ongoing evaluation of the  patient's history with his family he was decided to be a candidate for  two-third dose IV t-PA.  The son gave verbal permission to have an  angiogram, so two-third dose t-PA was given and the patient was sent to  Angiogram.  Of note, the patient had a platelet count of 82, therefore  the admitting physician gave him 2 units frozen platelets in order to  elevate the platelet count.  He was admitted to the ICU for further  evaluation.   HOSPITAL COURSE:  The patient with severe dysarthria and hematemesis at  24 hours.  He remained with a groin sheath in secondary to t-PA.  Stroke  workup was ordered and plans to remove the sheaths were made.  Following  day, platelet level was 66.  Additional 2 units of frozen platelets were  given in order that sheaths could be removed.  On the second hospital  day, the patient was significantly neurologically improved to the point  that he had very little lower extremity weakness and had almost  completely resolved all neuro symptoms.  The stroke workup, as mentioned  before, the patient had been found to have a platelet count of 82,000.  This was unknown prior to admission and hematologist was called.  Also,  during his workup, his 2-D echo showed an EF of 25% and an LV clot.  The  patient was placed on IV heparin and Coumadin for stroke prevention and  to manage the clot.  Heparin antibody panel was positive, therefore,  heparin was stopped and the patient was placed on Refludan, then the  patient developed a rash on Refludan and that was stopped.  He will be  continued on Coumadin only at the time of discharge for his LV thrombus.  He has a cardiologist in [place] that he plans to visit the day of  discharge and plans were per Dr. Samuel Jester to manage his PT/INR as  an outpatient and a phone call was made to  her office to make these  arrangements and the patient will follow up  there for labs on Wednesday  at 3:10.  Related to his stroke, he has neurologically cleared except  for some mild ataxia in his lower extremities when he has been standing  for a long time.  We will ask for outpatient PT to continue working with  him.   CONDITION ON DISCHARGE:  The patient is alert and oriented x3.  No  aphasia.  No dysarthria.  Eye movements are full.  Face is symmetric.  Tongue is midline.  He has no obvious focal deficits.  His gait is  minimally unsteady.   DISCHARGE/PLAN:  1. Discharge home with family.  2. Coumadin for clot management, and low EF, stroke prevention.  3. Outpatient PT.  4. Follow up with Dr. Adella Hare within 1 week (plans are for the      patient to transfer there today upon discharge).  5. Follow up with Dr. Samuel Jester on Wednesday, May 30, 2008, for      INR and Coumadin check.  6. Follow up with Dr. Pearlean Brownie in 2 months.      Annie Main, N.P.    ______________________________  Sunny Schlein. Pearlean Brownie, MD    SB/MEDQ  D:  05/28/2008  T:  05/29/2008  Job:  [id number]   cc:   Rose Phi. Myna Hidalgo, M.D.  Elenora Fender Adella Hare, MD

## 2010-09-12 ENCOUNTER — Emergency Department (HOSPITAL_COMMUNITY): Payer: Medicare Other

## 2010-09-12 ENCOUNTER — Inpatient Hospital Stay (HOSPITAL_COMMUNITY): Payer: Medicare Other

## 2010-09-12 ENCOUNTER — Encounter (HOSPITAL_COMMUNITY): Payer: Self-pay | Admitting: Radiology

## 2010-09-12 ENCOUNTER — Inpatient Hospital Stay (HOSPITAL_COMMUNITY)
Admission: EM | Admit: 2010-09-12 | Discharge: 2010-09-15 | DRG: 080 | Disposition: A | Discharge status: Home / Self Care | Payer: Medicare Other | Attending: Emergency Medicine | Admitting: Emergency Medicine

## 2010-09-12 DIAGNOSIS — Z794 Long term (current) use of insulin: Secondary | ICD-10-CM

## 2010-09-12 DIAGNOSIS — I739 Peripheral vascular disease, unspecified: Secondary | ICD-10-CM | POA: Diagnosis present

## 2010-09-12 DIAGNOSIS — Z7902 Long term (current) use of antithrombotics/antiplatelets: Secondary | ICD-10-CM

## 2010-09-12 DIAGNOSIS — I059 Rheumatic mitral valve disease, unspecified: Secondary | ICD-10-CM

## 2010-09-12 DIAGNOSIS — E119 Type 2 diabetes mellitus without complications: Secondary | ICD-10-CM | POA: Diagnosis present

## 2010-09-12 DIAGNOSIS — Z79899 Other long term (current) drug therapy: Secondary | ICD-10-CM

## 2010-09-12 DIAGNOSIS — I10 Essential (primary) hypertension: Secondary | ICD-10-CM | POA: Diagnosis present

## 2010-09-12 DIAGNOSIS — E785 Hyperlipidemia, unspecified: Secondary | ICD-10-CM | POA: Diagnosis present

## 2010-09-12 DIAGNOSIS — J4489 Other specified chronic obstructive pulmonary disease: Secondary | ICD-10-CM | POA: Diagnosis present

## 2010-09-12 DIAGNOSIS — Z86718 Personal history of other venous thrombosis and embolism: Secondary | ICD-10-CM

## 2010-09-12 DIAGNOSIS — I428 Other cardiomyopathies: Secondary | ICD-10-CM | POA: Diagnosis present

## 2010-09-12 DIAGNOSIS — N179 Acute kidney failure, unspecified: Secondary | ICD-10-CM | POA: Diagnosis present

## 2010-09-12 DIAGNOSIS — Z86711 Personal history of pulmonary embolism: Secondary | ICD-10-CM

## 2010-09-12 DIAGNOSIS — F17200 Nicotine dependence, unspecified, uncomplicated: Secondary | ICD-10-CM | POA: Diagnosis present

## 2010-09-12 DIAGNOSIS — Z8673 Personal history of transient ischemic attack (TIA), and cerebral infarction without residual deficits: Secondary | ICD-10-CM

## 2010-09-12 DIAGNOSIS — Z95810 Presence of automatic (implantable) cardiac defibrillator: Secondary | ICD-10-CM

## 2010-09-12 DIAGNOSIS — D45 Polycythemia vera: Secondary | ICD-10-CM | POA: Diagnosis present

## 2010-09-12 DIAGNOSIS — D696 Thrombocytopenia, unspecified: Secondary | ICD-10-CM | POA: Diagnosis present

## 2010-09-12 DIAGNOSIS — M109 Gout, unspecified: Secondary | ICD-10-CM | POA: Diagnosis present

## 2010-09-12 DIAGNOSIS — R404 Transient alteration of awareness: Principal | ICD-10-CM | POA: Diagnosis present

## 2010-09-12 DIAGNOSIS — K219 Gastro-esophageal reflux disease without esophagitis: Secondary | ICD-10-CM | POA: Diagnosis present

## 2010-09-12 DIAGNOSIS — I2510 Atherosclerotic heart disease of native coronary artery without angina pectoris: Secondary | ICD-10-CM | POA: Diagnosis present

## 2010-09-12 DIAGNOSIS — I63239 Cerebral infarction due to unspecified occlusion or stenosis of unspecified carotid arteries: Secondary | ICD-10-CM

## 2010-09-12 DIAGNOSIS — J9600 Acute respiratory failure, unspecified whether with hypoxia or hypercapnia: Secondary | ICD-10-CM

## 2010-09-12 DIAGNOSIS — I509 Heart failure, unspecified: Secondary | ICD-10-CM | POA: Diagnosis present

## 2010-09-12 DIAGNOSIS — G459 Transient cerebral ischemic attack, unspecified: Secondary | ICD-10-CM

## 2010-09-12 DIAGNOSIS — J449 Chronic obstructive pulmonary disease, unspecified: Secondary | ICD-10-CM | POA: Diagnosis present

## 2010-09-12 DIAGNOSIS — Z951 Presence of aortocoronary bypass graft: Secondary | ICD-10-CM

## 2010-09-12 DIAGNOSIS — Z7901 Long term (current) use of anticoagulants: Secondary | ICD-10-CM

## 2010-09-12 HISTORY — DX: Cerebral infarction, unspecified: I63.9

## 2010-09-12 HISTORY — DX: Chronic obstructive pulmonary disease, unspecified: J44.9

## 2010-09-12 HISTORY — DX: Essential (primary) hypertension: I10

## 2010-09-12 HISTORY — DX: Presence of automatic (implantable) cardiac defibrillator: Z95.810

## 2010-09-12 HISTORY — DX: Transient cerebral ischemic attack, unspecified: G45.9

## 2010-09-12 HISTORY — DX: Peripheral vascular disease, unspecified: I73.9

## 2010-09-12 LAB — COMPREHENSIVE METABOLIC PANEL
ALT: 16 U/L (ref 0–53)
AST: 27 U/L (ref 0–37)
Alkaline Phosphatase: 27 U/L — ABNORMAL LOW (ref 39–117)
CO2: 24 mEq/L (ref 19–32)
Calcium: 8.9 mg/dL (ref 8.4–10.5)
Chloride: 103 mEq/L (ref 96–112)
GFR calc Af Amer: >60 mL/min (ref >60)
GFR calc non Af Amer: 50 mL/min — ABNORMAL LOW (ref >60)
Glucose, Bld: 172 mg/dL — ABNORMAL HIGH (ref 70–99)
Potassium: 4.3 mEq/L (ref 3.5–5.1)
Sodium: 140 mEq/L (ref 135–145)
Total Bilirubin: 1.1 mg/dL (ref 0.3–1.2)

## 2010-09-12 LAB — CBC
HCT: 47.7 % (ref 39.0–52.0)
Hemoglobin: 17.6 g/dL — ABNORMAL HIGH (ref 13.0–17.0)
MCHC: 36.9 g/dL — ABNORMAL HIGH (ref 30.0–36.0)
MCV: 90 fL (ref 78.0–100.0)
Platelets: 89 10*3/uL — ABNORMAL LOW (ref 150–400)
RBC: 5.3 MIL/uL (ref 4.22–5.81)
RDW: 13.3 % (ref 11.5–15.5)

## 2010-09-12 LAB — DIFFERENTIAL
Basophils Relative: 0 % (ref 0–1)
Lymphocytes Relative: 37 % (ref 12–46)
Lymphs Abs: 3.6 10*3/uL (ref 0.7–4.0)
Monocytes Absolute: 0.8 10*3/uL (ref 0.1–1.0)
Monocytes Relative: 9 % (ref 3–12)
Neutro Abs: 4.7 10*3/uL (ref 1.7–7.7)
Neutrophils Relative %: 49 % (ref 43–77)

## 2010-09-12 LAB — RAPID URINE DRUG SCREEN, HOSP PERFORMED
Barbiturates: NOT DETECTED
Benzodiazepines: NOT DETECTED

## 2010-09-12 LAB — POCT I-STAT 3, ART BLOOD GAS (G3+)
Acid-Base Excess: 3 mmol/L — ABNORMAL HIGH (ref 0.0–2.0)
Bicarbonate: 28.3 mEq/L — ABNORMAL HIGH (ref 20.0–24.0)
pCO2 arterial: 41.7 mmHg (ref 35.0–45.0)
pH, Arterial: 7.435 (ref 7.350–7.450)
pO2, Arterial: 337 mmHg — ABNORMAL HIGH (ref 80.0–100.0)

## 2010-09-12 LAB — URINALYSIS, ROUTINE W REFLEX MICROSCOPIC
Bilirubin Urine: NEGATIVE
Hgb urine dipstick: NEGATIVE
Ketones, ur: NEGATIVE mg/dL
Nitrite: NEGATIVE
pH: 7 (ref 5.0–8.0)

## 2010-09-12 LAB — ETHANOL: Alcohol, Ethyl (B): <11 mg/dL (ref 0–11)

## 2010-09-12 LAB — GLUCOSE, CAPILLARY
Glucose-Capillary: 158 mg/dL — ABNORMAL HIGH (ref 70–99)
Glucose-Capillary: 123 mg/dL — ABNORMAL HIGH (ref 70–99)

## 2010-09-12 LAB — APTT: aPTT: 35 seconds (ref 24–37)

## 2010-09-12 LAB — CK TOTAL AND CKMB (NOT AT ARMC)
CK, MB: 2.2 ng/mL (ref 0.3–4.0)
Relative Index: 2.2 (ref 0.0–2.5)

## 2010-09-12 LAB — URINE MICROSCOPIC-ADD ON

## 2010-09-12 LAB — PROTIME-INR: INR: 1.47 (ref 0.00–1.49)

## 2010-09-12 LAB — TROPONIN I: Troponin I: <0.3 ng/mL (ref <0.30)

## 2010-09-12 LAB — LACTIC ACID, PLASMA: Lactic Acid, Venous: 1.4 mmol/L (ref 0.5–2.2)

## 2010-09-12 LAB — SALICYLATE LEVEL: Salicylate Lvl: <2 mg/dL — ABNORMAL LOW (ref 2.8–20.0)

## 2010-09-12 MED ORDER — IOHEXOL 350 MG/ML SOLN
100.0000 mL | Freq: Once | INTRAVENOUS | Status: AC | PRN
  Administered 2010-09-12: 100 mL via INTRAVENOUS

## 2010-09-13 ENCOUNTER — Inpatient Hospital Stay (HOSPITAL_COMMUNITY): Payer: Medicare Other

## 2010-09-13 LAB — BLOOD GAS, ARTERIAL
Acid-base deficit: 1.6 mmol/L (ref 0.0–2.0)
Bicarbonate: 21.9 mEq/L (ref 20.0–24.0)
FIO2: 0.3 %
O2 Saturation: 97 %
TCO2: 22.9 mmol/L (ref 0–100)
pO2, Arterial: 82.5 mmHg (ref 80.0–100.0)

## 2010-09-13 LAB — APTT: aPTT: 35 seconds (ref 24–37)

## 2010-09-13 LAB — CBC
HCT: 47 % (ref 39.0–52.0)
Hemoglobin: 17 g/dL (ref 13.0–17.0)
MCH: 32.8 pg (ref 26.0–34.0)
MCHC: 36.2 g/dL — ABNORMAL HIGH (ref 30.0–36.0)

## 2010-09-13 LAB — BASIC METABOLIC PANEL
BUN: 16 mg/dL (ref 6–23)
Calcium: 8.1 mg/dL — ABNORMAL LOW (ref 8.4–10.5)
GFR calc non Af Amer: >60 mL/min (ref >60)
Glucose, Bld: 141 mg/dL — ABNORMAL HIGH (ref 70–99)

## 2010-09-13 NOTE — Procedures (Unsigned)
EEG NUMBER:  REFERRING PHYSICIAN:  Dr. Pearlean Brownie.  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  MEDICATIONS:  Propofol, NovoLog, Protonix, fentanyl.  CONDITIONS OF RECORDING:  This is a 16-channel EEG carried out with the patient in the asleep states.  DESCRIPTION:  The background activity consists of normal stage II sleep. Symmetrical sleep spindles and vertex until sharp transients are noted, superimposed on the background activity that consist of poorly-organized slow activity in the delta and beta range.  Hypoventilation was not performed.  Intermittent photic stimulation failed to elicit any change in the tracing.  IMPRESSION:  This is a normal asleep EEG.  No epileptiform activity is noted.          ______________________________ Thana Farr, MD    [id number] D:  09/12/2010 16:00:01  T:  09/13/2010 01:58:30  Job #:  [id number]

## 2010-09-14 LAB — CBC
HCT: 43.4 % (ref 39.0–52.0)
Hemoglobin: 15.8 g/dL (ref 13.0–17.0)
MCH: 33.4 pg (ref 26.0–34.0)
MCHC: 36.4 g/dL — ABNORMAL HIGH (ref 30.0–36.0)
RBC: 4.73 MIL/uL (ref 4.22–5.81)

## 2010-09-14 LAB — BASIC METABOLIC PANEL
BUN: 15 mg/dL (ref 6–23)
CO2: 24 mEq/L (ref 19–32)
GFR calc non Af Amer: >60 mL/min (ref >60)
Glucose, Bld: 140 mg/dL — ABNORMAL HIGH (ref 70–99)
Potassium: 3.9 mEq/L (ref 3.5–5.1)

## 2010-09-14 LAB — GLUCOSE, CAPILLARY
Glucose-Capillary: 142 mg/dL — ABNORMAL HIGH (ref 70–99)
Glucose-Capillary: 166 mg/dL — ABNORMAL HIGH (ref 70–99)
Glucose-Capillary: 168 mg/dL — ABNORMAL HIGH (ref 70–99)
Glucose-Capillary: 237 mg/dL — ABNORMAL HIGH (ref 70–99)

## 2010-09-15 DIAGNOSIS — I428 Other cardiomyopathies: Secondary | ICD-10-CM

## 2010-09-15 DIAGNOSIS — D696 Thrombocytopenia, unspecified: Secondary | ICD-10-CM

## 2010-09-15 DIAGNOSIS — R55 Syncope and collapse: Secondary | ICD-10-CM

## 2010-09-15 LAB — CBC
Hemoglobin: 15.5 g/dL (ref 13.0–17.0)
MCHC: 36.6 g/dL — ABNORMAL HIGH (ref 30.0–36.0)
RDW: 13.4 % (ref 11.5–15.5)
WBC: 8.7 10*3/uL (ref 4.0–10.5)

## 2010-09-15 LAB — PROTIME-INR
INR: 1.34 (ref 0.00–1.49)
Prothrombin Time: 16.8 seconds — ABNORMAL HIGH (ref 11.6–15.2)

## 2010-09-15 LAB — BASIC METABOLIC PANEL
CO2: 26 mEq/L (ref 19–32)
Calcium: 8.8 mg/dL (ref 8.4–10.5)
GFR calc non Af Amer: >60 mL/min (ref >60)
Potassium: 4 mEq/L (ref 3.5–5.1)
Sodium: 140 mEq/L (ref 135–145)

## 2010-09-15 LAB — GLUCOSE, CAPILLARY
Glucose-Capillary: 162 mg/dL — ABNORMAL HIGH (ref 70–99)
Glucose-Capillary: 155 mg/dL — ABNORMAL HIGH (ref 70–99)
Glucose-Capillary: 201 mg/dL — ABNORMAL HIGH (ref 70–99)

## 2010-10-08 NOTE — Discharge Summary (Signed)
NAMEREX, OESTERLE NO.:  [id number]  MEDICAL RECORD NO.:  [id number]  LOCATION:  3022                         FACILITY:  [facility]  PHYSICIAN:  Oley Balm. Sung Amabile, MD   DATE OF BIRTH:  [birth year]  DATE OF ADMISSION:  09/12/2010 DATE OF DISCHARGE:  09/15/2010                              DISCHARGE SUMMARY   DISCHARGE DIAGNOSES: 1. Acute loss of consciousness. 2. History of cerebrovascular accident/transient ischemic attack. 3. Severe dilated cardiomyopathy. 4. Thrombocytopenia. 5. Polypharmacy. 6. Active smoker.  HISTORY OF PRESENT ILLNESS:  Mr. Francois is a 70 year old male with a known history of multiple previous CVAs, COPD, still actively smoking, severe dilated cardiomyopathy, and history of DVT, PE on Coumadin who presented with altered mental status and left-sided weakness.  The patient required intubation in the emergency room for airway protection and pulmonary critical care, was called to admit the patient.  Per the patient's wife, the patient was found slumped over this kitchen table on the morning of admission.  The patient had been in his usual state of health prior to this.  Again, the patient was found slumped over the kitchen table.  His eyes were fluttering, and he seemed to be having some trouble breathing.  The patient was brought to the emergency room, Code Stroke was called, and neurologist were consulted for questionable stroke/TIA versus seizure.  LABORATORY DATA:  At the time of admission, September 12, 2010, urine drug screen was negative.  CBC, white blood cells 9.6, hemoglobin 17.6, hematocrit 47.7, platelets 89.  Basic metabolic panel, sodium 140, potassium 4.3, glucose 172, BUN 27, creatinine 1.4, INR 1.47, noticed as subtherapeutic as the patient is on maintenance Coumadin.  Troponin less than 0.3.  Tylenol level less than 15.  Alcohol level less than 11. Aspirin level less than 2.  Lactic acid 1.4.  Urinalysis was negative. ABG on  the ventilator 30% FiO2, pH 7.44, pCO2 of 32, pO2 of 82.5, bicarb 21.  Most recent laboratory data, September 15, 2010, basic metabolic panel, sodium 140, potassium 4.0, glucose 167, BUN 16, creatinine 1.14.  CBC, white blood cells 8.7, hemoglobin 15.5, hematocrit 42.4, and platelets 64.  INR 1.34.  RADIOLOGY DATA:  At the time of admission, September 12, 2010, portable chest x-ray shows cardiomegaly and pulmonary vascular congestion.  CT of the head on September 12, 2010, showed no acute findings.  CT angio of the neck shows atherosclerotic calcifications and noncalcified plaque at the carotid bifurcations bilaterally without significant stenosis, aberrant right subclavian artery, and small right vertebral artery without significant vertebral artery stenosis.  CTA of the head shows a distal small vessel disease.  No significant proximal stenosis, aneurysm, or branch vessel occlusion, atherosclerotic calcifications within the cavernous and supraclinoid segments.  No evidence for acute cortical infarct.  MICRO DATA:  No micro data available this admission.  HOSPITAL COURSE BY DISCHARGE DIAGNOSES:  Please see HPI. 1. Loss of consciousness.  Again, etiology is unclear, decisions are     TIA versus subclinical seizure versus Todd paralysis.  The patient     had a significant left-sided weakness as well as altered mental     status.  However, CT and CTA of the  head have been negative, and     EEG was also negative.  The patient recovered very quickly and     regained all neurologic function in less than 24 hours and was     quickly extubated.  The patient has had multiple CVAs/TIAs in the     past.  This can continue to be worked up on an outpatient basis.     The patient is known to Dr. Alphonzo Dublin, neurologist at [facility].  The     patient was seen in consultation by Neurology this admission who     had nothing further to offer. 2. History of CVA/TIA.  The patient has also had history of PE and     DVT.  He  is on Coumadin at baseline, although he was subtherapeutic     on admission.  Again, he will follow up with his neurologist as an     outpatient. 3. Severe dilated cardiomyopathy.  No change from previous Rx. 4. Thrombocytopenia.  This is again unknown etiology.  There is some     question as whether the patient has a history of HITT, although he     did not receive any heparin this admission.  The patient is on     Plavix and Coumadin at baseline, feel it is too risky to continue     both of these in the setting of thrombocytopenia and I believe he     needs to continue his Coumadin given history of CVA, AFib, and     DVTs.  The patient will be discharged on his Coumadin, but we will     discontinue his Plavix.  The patient will follow up with Dr. Charm Barges     for Coumadin management and for thrombocytopenia. 5. Polypharmacy. 6. Smoker.  The patient has been counseled extensively about smoking     cessation given his multiple comorbidities, and he continues to     increase his risk factors for these.  DISCHARGE MEDICATIONS: 1. Coumadin.  The patient is to take 7.5 mg p.o. daily until seen by     Dr. Charm Barges on September 17, 2010.2. Calcium and vitamin D 2 tablets p.o. daily. 3. Coreg 6.25 mg p.o. b.i.d. 4. Vitamin D 2 tablets p.o. daily. 5. Levemir sliding scale subcutaneously daily as per previous. 6. Ranitidine 150 mg p.o. b.i.d. 7. Temazepam 15 mg p.o. at bedtime p.r.n.  The patient is to stop taking the following medications. 1. Plavix. 2. Lasix. 3. Crestor. 4. Allopurinol. 5. Fish oil. 6. Neurontin  DISCHARGE ACTIVITY:  No restrictions.  DISCHARGE DIET:  Carb-modified diabetic diet.  DISCHARGE FOLLOWUP APPOINTMENTS: 1. Dr. Charm Barges on Wednesday, September 17, 2010, at 10:30 a.m. for Coumadin     check and a CBC.  The patient should also follow up with Dr. Charm Barges     in 1-2 weeks for full visit. 2. Dr. Adella Hare, cardiologist in 2-3 weeks. 3. Dr. Alphonzo Dublin, neurologist in 1-2 weeks.   The patient prefers to call     and make these appointments himself.  DISPOSITION:  The patient has met maximum benefits from his inpatient hospitalization.  He is medically cleared and ready for discharge to home.  His acute neurologic event appears resolved, and his chronic medical problems are managed as an outpatient by primary care physician and cardiologist.  Greater than 30 minutes was spent on this discharge.     Dirk Dress, NP   ______________________________ Oley Balm Sung Amabile, MD    KW/MEDQ  D:  09/15/2010  T:  09/16/2010  Job:  [id number]  cc:   Samuel Jester, MD Janan Ridge, MD Marveen Reeks. Adella Hare, MD  Electronically Signed by Danford Bad N.P. on 09/17/2010 03:43:00 PM Electronically Signed by Billy Fischer MD on 10/08/2010 03:41:24 AM

## 2010-10-16 NOTE — Consult Note (Signed)
NAME:  DONZELL, COLLER NO.:  [id number]  MEDICAL RECORD NO.:  [id number]  LOCATION:  3102                         FACILITY:  [facility]  PHYSICIAN:  Melvyn Novas, M.D.  DATE OF BIRTH:  [birth year]  DATE OF CONSULTATION:  09/12/2010 DATE OF DISCHARGE:                                CONSULTATION   HISTORY OF PRESENT ILLNESS:  This is a 70 year old Caucasian male right- handed who arrived at 3 hours 46 minutes this a.m. at the [facility] ER in a transfer from [facility].  The patient was found slumped over at his kitchen table this morning and a Code Stroke had not been called initially.  The patient has a history of lung disease and cardiovascular disease and it was presumed that he may have had a heart attack or cardiopulmonary arrest.  Apparently, he was last seen "normal" at about 1:30, I understand a.m. when he walked his dog.  Again at 3:30, he was found with his head and torso slumped on the kitchen table, his eyes were fluttering, and he seemed to have trouble breathing or arrested breathing.  As the EMS picked the patient up, spontaneous movement was noted in his both lower extremities but I was told that the left upper extremity did not move.  However, here at [facility], I see that the patient is moving his left arm but is neglecting to move his right arm and that his right leg is pointing outwards.  I have to add that the patient by this time is sedated and intubated and on the Critical Care Medicine Service.  PAST MEDICAL AND SURGICAL HISTORY:  The patient has a history of stroke. He has COPD, CHF, coronary artery disease at 20% ejection fraction due to cardiomyopathy, a diastolic and dilated cardiomyopathy.  He has an ICD placed which prevents him from having an MRI today.  He has a history of chronic shortness of breath, possible CVA or TIA in April 2012 and previous strokes and diabetes mellitus, gouty arthritis, and previous strokes have left  him with a left-sided residual weakness. Again, this is not what I see here today.  Status post AAA repair and was MRSA positive in the last admission.  Stroke risk factors are manifold given the above list.  REVIEW OF SYSTEMS:  The patient cannot contribute.  FAMILY HISTORY:  The patient cannot contribute.  SOCIAL HISTORY:  As per Critical Care Medicine, the patient has been disabled.  He is a current smoker and ex-drinker.  He is married and lives at a private residence.  He smokes cigars.  FAMILY HISTORY:  Not provided.  ALLERGIES:  The patient also supposedly had an anaphylactic shock to ASPIRIN and is therefore listed as ASPIRIN allergic.  HOME MEDICATIONS:  He has not been placed on Coumadin or Pradaxa for reasons I cannot distinguish at this time, but has been using Levemir injections at home, so he is considered anticoagulated.  REVIEW OF SYSTEMS:  Again, the patient cannot contribute.  PHYSICAL EXAMINATION:  Temperature at this time was 98 degrees Fahrenheit.  The patient is on propofol with pinpoint pupils.  He does not respond to verbal commands but has symmetric deep tendon  reflexes and upgoing toes bilaterally to plantar stimulation.  He can neither perform coordination or gait exam nor sensory exam at this point.  It appears that his due to his intubation, his tongue may be deviated to the right but again that is not accessible in full.  Evaluation of the retinal area is not possible with the pinpoint pupils he currently presents with.  Blood pressure is 130/90, pulse rate was 71, respiratory rate was 12 after intubation, and again temperature was 97.8 oral.  CURRENT MEDICATIONS:  The patient is currently on allopurinol, cholecalciferol, Crestor, fish oil, gabapentin, temazepam at night, warfarin is listed here but I see the Levemir injections and I do not think that this is truly an acute medication.  He is on carvedilol and ranitidine as well as Lasix.  Again,  highly allergic to ASPIRIN.  LABORATORY DATA:  The patient had to be suctioned multiple times and is now in transfer to 3100 where he will resume room 2.  Breathing sounds were equally bilaterally.  Oxygen saturation appears to be normal post- intubation.  The patient tested negative for salicylate and negative for a drug abuse screen.  He has mild proteinuria, glucosuria.  His CK-MB was 2.3.  His CK total was 85.  His troponin level was at normal limits. Alcohol was negative.  INR was 1.47.  Prothrombin time 18.1.  White blood cell count 9.6, red blood cell count 5.3, hemoglobin 17.6, hematocrit 47.7, neutrophils 49%, and platelet count only 89,000.   Discussion/  IMPRESSION AND PLAN:  I do not suspect an myocardial infarct based on these lab results.  The patient was presented to me as not anticoagulated and the INR is clearly elevated, so he may well be on Coumadin.  We know that he is on Levemir shots.  I was resistant to giving the patient tPA because of the 3-hour window having already passed when I was speaking to Dr. Deretha Emory this morning on the phone and his overall health does not permit an extension of the time window to use tPA in any case.  I base this decision clearly on the clinical history and presentation as I was not made aware of the patient's INR at the point.   Again, the patient at this time no longer presents with any left-sided weakness which was his initial presentation, but I do not see him spontaneously moving his right as much.  He actually tried to remove the breathing tube with the left hand and had to be restrained.  At this time, I am not sure that the patient suffered a stroke.  His CT was reviewed and found to be normal.   His chest x-ray is normal.  I want  this patient to undergo a CT angio possibly later today to see if any blockage has occurred.   He does have CVA risk factors and an embolic stroke cannot be ruled out.   The edematous changes  surrounding a CVA could  not be detected on todays earlier brain  CT- may not have developed by  this morning.  Again, an MRI is not possibly obtained in a patient with a defibrillator implant.  I am having Dr. Pearlean Brownie of the Neurovascular Team follow the patient and supportive care is at this time indicated.  I suspect that the patient can be extubated as of lunch today, September 12, 2010.   I faxed the notes from the Critical Care Medicine Team for the consultation, Dr. Deretha Emory, ED and a copy  to Dr. Delia Heady, neurologist and stroke MD.   The patient was admitted for regular neuro checks to the 3100 unit and will stay here as long as intubated.  I charged a level 5 consultation for this patient but spent only 40 minutes in the patient's bedroom and with the obtainment of history and physical I explained the consideration for not giving tPA.     Melvyn Novas, M.D.     CD/MEDQ  D:  09/12/2010  T:  09/12/2010  Job:  [id number]  Electronically Signed by Melvyn Novas M.D. on 10/16/2010 12:08:21 PM

## 2012-10-10 DIAGNOSIS — M6281 Muscle weakness (generalized): Secondary | ICD-10-CM

## 2012-10-28 DIAGNOSIS — M6281 Muscle weakness (generalized): Secondary | ICD-10-CM

## 2012-11-12 ENCOUNTER — Emergency Department (HOSPITAL_COMMUNITY): Payer: Medicare Other

## 2012-11-12 ENCOUNTER — Inpatient Hospital Stay (HOSPITAL_COMMUNITY)
Admission: EM | Admit: 2012-11-12 | Discharge: 2012-11-14 | DRG: 302 | Payer: Medicare Other | Attending: Internal Medicine | Admitting: Internal Medicine

## 2012-11-12 ENCOUNTER — Encounter (HOSPITAL_COMMUNITY): Payer: Self-pay | Admitting: Emergency Medicine

## 2012-11-12 DIAGNOSIS — I5023 Acute on chronic systolic (congestive) heart failure: Secondary | ICD-10-CM | POA: Diagnosis present

## 2012-11-12 DIAGNOSIS — I739 Peripheral vascular disease, unspecified: Secondary | ICD-10-CM | POA: Diagnosis present

## 2012-11-12 DIAGNOSIS — R791 Abnormal coagulation profile: Secondary | ICD-10-CM | POA: Diagnosis present

## 2012-11-12 DIAGNOSIS — Z794 Long term (current) use of insulin: Secondary | ICD-10-CM

## 2012-11-12 DIAGNOSIS — I252 Old myocardial infarction: Secondary | ICD-10-CM

## 2012-11-12 DIAGNOSIS — I200 Unstable angina: Secondary | ICD-10-CM | POA: Diagnosis present

## 2012-11-12 DIAGNOSIS — I2589 Other forms of chronic ischemic heart disease: Secondary | ICD-10-CM | POA: Diagnosis present

## 2012-11-12 DIAGNOSIS — R079 Chest pain, unspecified: Secondary | ICD-10-CM | POA: Diagnosis present

## 2012-11-12 DIAGNOSIS — J4489 Other specified chronic obstructive pulmonary disease: Secondary | ICD-10-CM | POA: Diagnosis present

## 2012-11-12 DIAGNOSIS — Z951 Presence of aortocoronary bypass graft: Secondary | ICD-10-CM

## 2012-11-12 DIAGNOSIS — D696 Thrombocytopenia, unspecified: Secondary | ICD-10-CM | POA: Diagnosis present

## 2012-11-12 DIAGNOSIS — Z95810 Presence of automatic (implantable) cardiac defibrillator: Secondary | ICD-10-CM

## 2012-11-12 DIAGNOSIS — IMO0001 Reserved for inherently not codable concepts without codable children: Secondary | ICD-10-CM | POA: Diagnosis present

## 2012-11-12 DIAGNOSIS — E7800 Pure hypercholesterolemia, unspecified: Secondary | ICD-10-CM | POA: Diagnosis present

## 2012-11-12 DIAGNOSIS — I2510 Atherosclerotic heart disease of native coronary artery without angina pectoris: Principal | ICD-10-CM | POA: Diagnosis present

## 2012-11-12 DIAGNOSIS — N184 Chronic kidney disease, stage 4 (severe): Secondary | ICD-10-CM | POA: Diagnosis present

## 2012-11-12 DIAGNOSIS — I10 Essential (primary) hypertension: Secondary | ICD-10-CM

## 2012-11-12 DIAGNOSIS — R0781 Pleurodynia: Secondary | ICD-10-CM

## 2012-11-12 DIAGNOSIS — Z7901 Long term (current) use of anticoagulants: Secondary | ICD-10-CM

## 2012-11-12 DIAGNOSIS — I129 Hypertensive chronic kidney disease with stage 1 through stage 4 chronic kidney disease, or unspecified chronic kidney disease: Secondary | ICD-10-CM | POA: Diagnosis present

## 2012-11-12 DIAGNOSIS — J449 Chronic obstructive pulmonary disease, unspecified: Secondary | ICD-10-CM

## 2012-11-12 DIAGNOSIS — D6832 Hemorrhagic disorder due to extrinsic circulating anticoagulants: Secondary | ICD-10-CM

## 2012-11-12 DIAGNOSIS — D649 Anemia, unspecified: Secondary | ICD-10-CM | POA: Diagnosis present

## 2012-11-12 DIAGNOSIS — Z8673 Personal history of transient ischemic attack (TIA), and cerebral infarction without residual deficits: Secondary | ICD-10-CM

## 2012-11-12 DIAGNOSIS — G4733 Obstructive sleep apnea (adult) (pediatric): Secondary | ICD-10-CM | POA: Diagnosis present

## 2012-11-12 DIAGNOSIS — T45515A Adverse effect of anticoagulants, initial encounter: Secondary | ICD-10-CM | POA: Diagnosis present

## 2012-11-12 DIAGNOSIS — R0602 Shortness of breath: Secondary | ICD-10-CM

## 2012-11-12 DIAGNOSIS — Z9861 Coronary angioplasty status: Secondary | ICD-10-CM

## 2012-11-12 DIAGNOSIS — I509 Heart failure, unspecified: Secondary | ICD-10-CM | POA: Diagnosis present

## 2012-11-12 LAB — PROTIME-INR: Prothrombin Time: 26 seconds — ABNORMAL HIGH (ref 11.6–15.2)

## 2012-11-12 MED ORDER — ONDANSETRON HCL 4 MG/2ML IJ SOLN
4.0000 mg | Freq: Once | INTRAMUSCULAR | Status: AC
  Administered 2012-11-12: 4 mg via INTRAVENOUS
  Filled 2012-11-12: qty 2

## 2012-11-12 MED ORDER — FENTANYL CITRATE 0.05 MG/ML IJ SOLN
50.0000 ug | INTRAMUSCULAR | Status: DC | PRN
  Administered 2012-11-12 – 2012-11-13 (×2): 50 ug via INTRAVENOUS
  Filled 2012-11-12 (×2): qty 2

## 2012-11-12 NOTE — ED Notes (Signed)
Patient brought in by EMS for fall, having CP x 2 days, history of ICP with pacemaker

## 2012-11-12 NOTE — ED Provider Notes (Signed)
CSN: [id number]     Arrival date & time 11/12/12  2251 History     First MD Initiated Contact with Patient 11/12/12 2255     Chief Complaint  Patient presents with  . Chest Pain   (Consider location/radiation/quality/duration/timing/severity/associated sxs/prior Treatment) Patient is a 72 y.o. male presenting with chest pain.  Chest Pain Associated symptoms: shortness of breath   Associated symptoms: no abdominal pain, no back pain and no fever    Hx per PT - Has h/o CAD, DM, Pacer/ defib, on coumadin with recent admit at [facility] for GI Bleeding.  He has restarted coumadin and having CP for the last 3 days became more severe tonight , sharp in quality, associated SOB, BIB EMS reported tachypnea improved with oxygen. NTG improved pain but still present. No hypoxia. PT states he has had some inc ankle swelling, no black or tarry stools. No syncope. No ABD pain.  Past Medical History  Diagnosis Date  . CVA (cerebral vascular accident)   . Diabetes mellitus   . Hypertension   . COPD (chronic obstructive pulmonary disease)   . TIA (transient ischemic attack)   . Presence of cardiac defibrillator   . PVD (peripheral vascular disease)    Past Surgical History  Procedure Laterality Date  . Bypass graft      (2) Triple CABG, 5 known stents in place. Pt has ICD with pacemaker in place.    No family history on file. History  Substance Use Topics  . Smoking status: Not on file  . Smokeless tobacco: Current User  . Alcohol Use: Not on file    Review of Systems  Constitutional: Negative for fever and chills.  HENT: Negative for neck pain and neck stiffness.   Eyes: Negative for visual disturbance.  Respiratory: Positive for shortness of breath.   Cardiovascular: Positive for chest pain and leg swelling.  Gastrointestinal: Negative for abdominal pain.  Genitourinary: Negative for flank pain.  Musculoskeletal: Negative for back pain.  Skin: Negative for rash.   Neurological: Negative for syncope.  All other systems reviewed and are negative.    Allergies  Aspirin and Ivp dye  Home Medications  No current outpatient prescriptions on file. BP 116/67  Temp(Src) 98.8 F (37.1 C) (Oral)  Resp 16  SpO2 100% Physical Exam  Constitutional: He is oriented to person, place, and time. He appears well-developed and well-nourished.  HENT:  Head: Normocephalic and atraumatic.  Eyes: EOM are normal. Pupils are equal, round, and reactive to light.  Neck: Neck supple.  Cardiovascular: Regular rhythm and intact distal pulses.   Pulmonary/Chest: Effort normal and breath sounds normal. No stridor. No respiratory distress. He exhibits no tenderness.  Tachypneic, no rales or wheezes  Abdominal: Soft. He exhibits no distension. There is no tenderness.  Musculoskeletal: Normal range of motion. He exhibits no tenderness.  1 plus pitting pretibial edema  Neurological: He is alert and oriented to person, place, and time.  Skin: Skin is warm and dry.    ED Course   Procedures (including critical care time)  Results for orders placed during the hospital encounter of 11/12/12  MRSA PCR SCREENING      Result Value Range   MRSA by PCR NEGATIVE  NEGATIVE  CBC      Result Value Range   WBC 6.1  4.0 - 10.5 K/uL   RBC 3.17 (*) 4.22 - 5.81 MIL/uL   Hemoglobin 8.6 (*) 13.0 - 17.0 g/dL   HCT 40.9 (*) 81.1 - 91.4 %  MCV 86.4  78.0 - 100.0 fL   MCH 27.1  26.0 - 34.0 pg   MCHC 31.4  30.0 - 36.0 g/dL   RDW 16.1 (*) 09.6 - 04.5 %   Platelets 98 (*) 150 - 400 K/uL  PROTIME-INR      Result Value Range   Prothrombin Time 26.0 (*) 11.6 - 15.2 seconds   INR 2.48 (*) 0.00 - 1.49  PRO B NATRIURETIC PEPTIDE      Result Value Range   Pro B Natriuretic peptide (BNP) 986.7 (*) 0 - 125 pg/mL  BASIC METABOLIC PANEL      Result Value Range   Sodium 140  135 - 145 mEq/L   Potassium 4.1  3.5 - 5.1 mEq/L   Chloride 106  96 - 112 mEq/L   CO2 26  19 - 32 mEq/L    Glucose, Bld 237 (*) 70 - 99 mg/dL   BUN 30 (*) 6 - 23 mg/dL   Creatinine, Ser 4.09 (*) 0.50 - 1.35 mg/dL   Calcium 8.7  8.4 - 81.1 mg/dL   GFR calc non Af Amer 31 (*) >90 mL/min   GFR calc Af Amer 36 (*) >90 mL/min  CBC      Result Value Range   WBC 7.1  4.0 - 10.5 K/uL   RBC 3.24 (*) 4.22 - 5.81 MIL/uL   Hemoglobin 8.6 (*) 13.0 - 17.0 g/dL   HCT 91.4 (*) 78.2 - 95.6 %   MCV 88.0  78.0 - 100.0 fL   MCH 26.5  26.0 - 34.0 pg   MCHC 30.2  30.0 - 36.0 g/dL   RDW 21.3 (*) 08.6 - 57.8 %   Platelets 97 (*) 150 - 400 K/uL  TROPONIN I      Result Value Range   Troponin I <0.30  <0.30 ng/mL  TROPONIN I      Result Value Range   Troponin I <0.30  <0.30 ng/mL  TROPONIN I      Result Value Range   Troponin I <0.30  <0.30 ng/mL  HEMOGLOBIN A1C      Result Value Range   Hemoglobin A1C 6.7 (*) <5.7 %   Mean Plasma Glucose 146 (*) <117 mg/dL  PROTIME-INR      Result Value Range   Prothrombin Time 26.6 (*) 11.6 - 15.2 seconds   INR 2.55 (*) 0.00 - 1.49  GLUCOSE, CAPILLARY      Result Value Range   Glucose-Capillary 190 (*) 70 - 99 mg/dL   Comment 1 Notify RN    GLUCOSE, CAPILLARY      Result Value Range   Glucose-Capillary 207 (*) 70 - 99 mg/dL   Comment 1 Notify RN    GLUCOSE, CAPILLARY      Result Value Range   Glucose-Capillary 191 (*) 70 - 99 mg/dL  GLUCOSE, CAPILLARY      Result Value Range   Glucose-Capillary 213 (*) 70 - 99 mg/dL  GLUCOSE, CAPILLARY      Result Value Range   Glucose-Capillary 247 (*) 70 - 99 mg/dL  OCCULT BLOOD, POC DEVICE      Result Value Range   Fecal Occult Bld NEGATIVE  NEGATIVE  POCT I-STAT TROPONIN I      Result Value Range   Troponin i, poc 0.03  0.00 - 0.08 ng/mL   Comment 3           POCT I-STAT, CHEM 8      Result Value Range   Sodium 144  135 - 145 mEq/L   Potassium 3.9  3.5 - 5.1 mEq/L   Chloride 106  96 - 112 mEq/L   BUN 29 (*) 6 - 23 mg/dL   Creatinine, Ser 1.61 (*) 0.50 - 1.35 mg/dL   Glucose, Bld 096 (*) 70 - 99 mg/dL    Calcium, Ion 0.45 (*) 1.13 - 1.30 mmol/L   TCO2 25  0 - 100 mmol/L   Hemoglobin 9.5 (*) 13.0 - 17.0 g/dL   HCT 40.9 (*) 81.1 - 91.4 %   Nm Pulmonary Perf And Vent  11/13/2012   *RADIOLOGY REPORT*  Clinical Data:  Pleuritic chest pain.  Shortness of breath.  Acute renal insufficiency (creatinine 2.0, estimated GFR 31) precluded IV contrast for CTA chest.  NUCLEAR MEDICINE VENTILATION - PERFUSION LUNG SCAN  Technique:  Ventilation images were obtained in multiple projections using inhaled aerosol technetium 99 M DTPA.  Perfusion images were obtained in multiple projections after intravenous injection of Tc-32m MAA.  Radiopharmaceuticals:  Tc-35m DTPA aerosol and 6 mCi Tc-30m MAA.  Comparison: No prior nuclear imaging.  Portable chest x-ray yesterday.  Findings:  Ventilation:  No localized ventilatory defect.  Photopenia related to the battery generator for the indwelling pacing defibrillator. Mild clumping of the DTPA aerosol in the central bronchi.  Perfusion:  Normal perfusion.  No segmental or subsegmental defects to suggest pulmonary embolism.  IMPRESSION: Normal examination.   Original Report Authenticated By: Hulan Saas, M.D.   Dg Chest Portable 1 View  11/12/2012   *RADIOLOGY REPORT*  Clinical Data: Chest pain for 2 days.  History of previous cardiac surgery.  PORTABLE CHEST - 1 VIEW  Comparison: 10/28/2012  Findings: Stable appearance of postoperative changes in the mediastinum and cardiac pacemaker.  Cardiac enlargement with normal pulmonary vascularity.  No focal consolidation or airspace disease in the lungs.  No blunting of costophrenic angles.  No pneumothorax.  Mediastinal contours appear intact.  Calcification of the aorta.  Degenerative changes in the shoulders.  No significant change since previous study.  IMPRESSION: Cardiac enlargement without significant vascular congestion or infiltration.   Original Report Authenticated By: Burman Nieves, M.D.       Date: 11/12/2012   Rate: 80  Rhythm: paced  QRS Axis: left  Intervals: paced  ST/T Wave abnormalities: nonspecific ST/T changes  Conduction Disutrbances:nonspecific intraventricular conduction delay  Narrative Interpretation:   Old EKG Reviewed: PVCs otherwise no sig changes from 09/12/10  IV fentanyl and Zofran provided Discussed with Dr. Lovell Sheehan, plan admit  MDM  Chest pain and shortness of breath recently off of Coumadin and has history of blood clots EKG. Imaging. Labs. Pain improved with IV narcotics Admit   Sunnie Nielsen, MD 11/14/12 [id number]

## 2012-11-12 NOTE — ED Notes (Signed)
Per EMS: Patient from home, initially called EMS for fall. On scene reports he has had CP x2 days. Pt has had 2 Triple Bypass, 5 stents in place. MI 7/13. Hx of TIA x5. Patient allergic to aspirin but did receive 2 NTG in route. NTG reduced pain to 6/10. Pt takes Coumadin for A-fib. CBG=264. NSR with PVC and bundle branch block.  V/S: 119/64, HR 110, R 24. Ax4, NAD

## 2012-11-13 ENCOUNTER — Observation Stay (HOSPITAL_COMMUNITY): Payer: Medicare Other

## 2012-11-13 ENCOUNTER — Encounter (HOSPITAL_COMMUNITY): Payer: Self-pay | Admitting: Internal Medicine

## 2012-11-13 DIAGNOSIS — I10 Essential (primary) hypertension: Secondary | ICD-10-CM

## 2012-11-13 DIAGNOSIS — I5023 Acute on chronic systolic (congestive) heart failure: Secondary | ICD-10-CM | POA: Diagnosis present

## 2012-11-13 DIAGNOSIS — I739 Peripheral vascular disease, unspecified: Secondary | ICD-10-CM | POA: Diagnosis present

## 2012-11-13 DIAGNOSIS — R0602 Shortness of breath: Secondary | ICD-10-CM

## 2012-11-13 DIAGNOSIS — T45511A Poisoning by anticoagulants, accidental (unintentional), initial encounter: Secondary | ICD-10-CM

## 2012-11-13 DIAGNOSIS — R071 Chest pain on breathing: Secondary | ICD-10-CM

## 2012-11-13 DIAGNOSIS — D6832 Hemorrhagic disorder due to extrinsic circulating anticoagulants: Secondary | ICD-10-CM

## 2012-11-13 DIAGNOSIS — R079 Chest pain, unspecified: Secondary | ICD-10-CM | POA: Diagnosis present

## 2012-11-13 DIAGNOSIS — J4489 Other specified chronic obstructive pulmonary disease: Secondary | ICD-10-CM

## 2012-11-13 DIAGNOSIS — J449 Chronic obstructive pulmonary disease, unspecified: Secondary | ICD-10-CM | POA: Diagnosis present

## 2012-11-13 DIAGNOSIS — R0781 Pleurodynia: Secondary | ICD-10-CM | POA: Insufficient documentation

## 2012-11-13 DIAGNOSIS — T458X1A Poisoning by other primarily systemic and hematological agents, accidental (unintentional), initial encounter: Secondary | ICD-10-CM

## 2012-11-13 LAB — BASIC METABOLIC PANEL
BUN: 30 mg/dL — ABNORMAL HIGH (ref 6–23)
Chloride: 106 mEq/L (ref 96–112)
Glucose, Bld: 237 mg/dL — ABNORMAL HIGH (ref 70–99)
Potassium: 4.1 mEq/L (ref 3.5–5.1)

## 2012-11-13 LAB — GLUCOSE, CAPILLARY
Glucose-Capillary: 207 mg/dL — ABNORMAL HIGH (ref 70–99)
Glucose-Capillary: 191 mg/dL — ABNORMAL HIGH (ref 70–99)
Glucose-Capillary: 213 mg/dL — ABNORMAL HIGH (ref 70–99)
Glucose-Capillary: 247 mg/dL — ABNORMAL HIGH (ref 70–99)

## 2012-11-13 LAB — PROTIME-INR: INR: 2.55 — ABNORMAL HIGH (ref 0.00–1.49)

## 2012-11-13 LAB — MRSA PCR SCREENING: MRSA by PCR: NEGATIVE

## 2012-11-13 LAB — TROPONIN I: Troponin I: <0.3 ng/mL (ref <0.30)

## 2012-11-13 LAB — POCT I-STAT, CHEM 8
BUN: 29 mg/dL — ABNORMAL HIGH (ref 6–23)
Creatinine, Ser: 1.9 mg/dL — ABNORMAL HIGH (ref 0.50–1.35)
Potassium: 3.9 mEq/L (ref 3.5–5.1)
Sodium: 144 mEq/L (ref 135–145)
TCO2: 25 mmol/L (ref 0–100)

## 2012-11-13 LAB — CBC
HCT: 28.5 % — ABNORMAL LOW (ref 39.0–52.0)
Hemoglobin: 8.6 g/dL — ABNORMAL LOW (ref 13.0–17.0)
MCH: 27.1 pg (ref 26.0–34.0)
MCH: 26.5 pg (ref 26.0–34.0)
MCHC: 31.4 g/dL (ref 30.0–36.0)
MCHC: 30.2 g/dL (ref 30.0–36.0)
Platelets: 98 10*3/uL — ABNORMAL LOW (ref 150–400)
RBC: 3.24 MIL/uL — ABNORMAL LOW (ref 4.22–5.81)

## 2012-11-13 LAB — HEMOGLOBIN A1C
Hgb A1c MFr Bld: 6.7 % — ABNORMAL HIGH (ref <5.7)
Mean Plasma Glucose: 146 mg/dL — ABNORMAL HIGH (ref <117)

## 2012-11-13 MED ORDER — TEMAZEPAM 15 MG PO CAPS
15.0000 mg | ORAL_CAPSULE | Freq: Every day | ORAL | Status: DC
  Administered 2012-11-13: 15 mg via ORAL
  Filled 2012-11-13: qty 1

## 2012-11-13 MED ORDER — FUROSEMIDE 10 MG/ML IJ SOLN
40.0000 mg | Freq: Two times a day (BID) | INTRAMUSCULAR | Status: DC
  Administered 2012-11-13 – 2012-11-14 (×3): 40 mg via INTRAVENOUS
  Filled 2012-11-13 (×5): qty 4

## 2012-11-13 MED ORDER — SODIUM CHLORIDE 0.9 % IJ SOLN: 3.0000 mL | INTRAMUSCULAR | Status: DC | PRN

## 2012-11-13 MED ORDER — INSULIN DETEMIR 100 UNIT/ML ~~LOC~~ SOLN: 0.0000 [IU] | Freq: Three times a day (TID) | SUBCUTANEOUS | Status: DC

## 2012-11-13 MED ORDER — SODIUM CHLORIDE 0.9 % IJ SOLN
3.0000 mL | Freq: Two times a day (BID) | INTRAMUSCULAR | Status: DC
  Administered 2012-11-13 – 2012-11-14 (×3): 3 mL via INTRAVENOUS

## 2012-11-13 MED ORDER — RAMIPRIL 2.5 MG PO CAPS
2.5000 mg | ORAL_CAPSULE | Freq: Every day | ORAL | Status: DC
  Administered 2012-11-13: 2.5 mg via ORAL
  Filled 2012-11-13 (×2): qty 1

## 2012-11-13 MED ORDER — VITAMIN C 500 MG PO TABS
500.0000 mg | ORAL_TABLET | Freq: Every day | ORAL | Status: DC
  Administered 2012-11-13 – 2012-11-14 (×2): 500 mg via ORAL
  Filled 2012-11-13 (×2): qty 1

## 2012-11-13 MED ORDER — ONDANSETRON HCL 4 MG/2ML IJ SOLN
4.0000 mg | Freq: Four times a day (QID) | INTRAMUSCULAR | Status: DC | PRN
  Administered 2012-11-13: 4 mg via INTRAVENOUS
  Filled 2012-11-13: qty 2

## 2012-11-13 MED ORDER — TECHNETIUM TC 99M DIETHYLENETRIAME-PENTAACETIC ACID
40.0000 | Freq: Once | INTRAVENOUS | Status: AC | PRN
  Administered 2012-11-13: 40 via RESPIRATORY_TRACT

## 2012-11-13 MED ORDER — SODIUM CHLORIDE 0.9 % IV SOLN: INTRAVENOUS | Status: DC

## 2012-11-13 MED ORDER — NITROGLYCERIN IN D5W 200-5 MCG/ML-% IV SOLN
5.0000 ug/min | INTRAVENOUS | Status: DC
  Administered 2012-11-13: 5 ug/min via INTRAVENOUS
  Filled 2012-11-13: qty 250

## 2012-11-13 MED ORDER — SODIUM CHLORIDE 0.9 % IV SOLN: 250.0000 mL | INTRAVENOUS | Status: DC | PRN

## 2012-11-13 MED ORDER — INSULIN DETEMIR 100 UNIT/ML ~~LOC~~ SOLN
10.0000 [IU] | Freq: Every day | SUBCUTANEOUS | Status: DC
  Administered 2012-11-13 – 2012-11-14 (×2): 10 [IU] via SUBCUTANEOUS
  Filled 2012-11-13 (×3): qty 0.1

## 2012-11-13 MED ORDER — DIPHENHYDRAMINE HCL 25 MG PO CAPS
25.0000 mg | ORAL_CAPSULE | Freq: Four times a day (QID) | ORAL | Status: DC | PRN
  Administered 2012-11-13: 25 mg via ORAL
  Filled 2012-11-13: qty 1

## 2012-11-13 MED ORDER — INSULIN ASPART 100 UNIT/ML ~~LOC~~ SOLN
0.0000 [IU] | SUBCUTANEOUS | Status: DC
  Administered 2012-11-13: 3 [IU] via SUBCUTANEOUS
  Administered 2012-11-13: 2 [IU] via SUBCUTANEOUS
  Administered 2012-11-13: 3 [IU] via SUBCUTANEOUS

## 2012-11-13 MED ORDER — CARVEDILOL 3.125 MG PO TABS
3.1250 mg | ORAL_TABLET | Freq: Two times a day (BID) | ORAL | Status: DC
  Administered 2012-11-13 – 2012-11-14 (×3): 3.125 mg via ORAL
  Filled 2012-11-13 (×5): qty 1

## 2012-11-13 MED ORDER — HYDROMORPHONE HCL PF 1 MG/ML IJ SOLN
0.5000 mg | INTRAMUSCULAR | Status: DC | PRN
  Administered 2012-11-13: 0.5 mg via INTRAVENOUS
  Filled 2012-11-13: qty 1

## 2012-11-13 MED ORDER — ONDANSETRON HCL 4 MG PO TABS: 4.0000 mg | ORAL_TABLET | Freq: Four times a day (QID) | ORAL | Status: DC | PRN

## 2012-11-13 MED ORDER — WARFARIN - PHYSICIAN DOSING INPATIENT: Freq: Every day | Status: DC

## 2012-11-13 MED ORDER — ACETAMINOPHEN 650 MG RE SUPP: 650.0000 mg | Freq: Four times a day (QID) | RECTAL | Status: DC | PRN

## 2012-11-13 MED ORDER — SIMVASTATIN 40 MG PO TABS
40.0000 mg | ORAL_TABLET | Freq: Every evening | ORAL | Status: DC
  Administered 2012-11-13: 40 mg via ORAL
  Filled 2012-11-13 (×2): qty 1

## 2012-11-13 MED ORDER — ALLOPURINOL 100 MG PO TABS
100.0000 mg | ORAL_TABLET | Freq: Every day | ORAL | Status: DC
  Administered 2012-11-13 – 2012-11-14 (×2): 100 mg via ORAL
  Filled 2012-11-13 (×2): qty 1

## 2012-11-13 MED ORDER — INSULIN ASPART 100 UNIT/ML ~~LOC~~ SOLN
0.0000 [IU] | Freq: Three times a day (TID) | SUBCUTANEOUS | Status: DC
  Administered 2012-11-13 – 2012-11-14 (×2): 3 [IU] via SUBCUTANEOUS

## 2012-11-13 MED ORDER — ACETAMINOPHEN 325 MG PO TABS: 650.0000 mg | ORAL_TABLET | Freq: Four times a day (QID) | ORAL | Status: DC | PRN

## 2012-11-13 MED ORDER — WARFARIN SODIUM 5 MG PO TABS
5.0000 mg | ORAL_TABLET | Freq: Every day | ORAL | Status: DC
  Administered 2012-11-13: 5 mg via ORAL
  Filled 2012-11-13 (×2): qty 1

## 2012-11-13 MED ORDER — SODIUM CHLORIDE 0.9 % IJ SOLN
3.0000 mL | Freq: Two times a day (BID) | INTRAMUSCULAR | Status: DC
  Administered 2012-11-13 – 2012-11-14 (×2): 3 mL via INTRAVENOUS

## 2012-11-13 MED ORDER — OXYCODONE HCL 5 MG PO TABS: 5.0000 mg | ORAL_TABLET | ORAL | Status: DC | PRN

## 2012-11-13 MED ORDER — TECHNETIUM TO 99M ALBUMIN AGGREGATED
6.0000 | Freq: Once | INTRAVENOUS | Status: AC | PRN
  Administered 2012-11-13: 6 via INTRAVENOUS

## 2012-11-13 MED ORDER — INSULIN ASPART 100 UNIT/ML ~~LOC~~ SOLN
0.0000 [IU] | Freq: Every day | SUBCUTANEOUS | Status: DC
  Administered 2012-11-13: 2 [IU] via SUBCUTANEOUS

## 2012-11-13 MED ORDER — VITAMIN D3 25 MCG (1000 UNIT) PO TABS
1000.0000 [IU] | ORAL_TABLET | Freq: Every day | ORAL | Status: DC
  Administered 2012-11-13 – 2012-11-14 (×2): 1000 [IU] via ORAL
  Filled 2012-11-13 (×2): qty 1

## 2012-11-13 MED ORDER — ZOLPIDEM TARTRATE 5 MG PO TABS: 5.0000 mg | ORAL_TABLET | Freq: Every evening | ORAL | Status: DC | PRN

## 2012-11-13 MED ORDER — ALUM & MAG HYDROXIDE-SIMETH 200-200-20 MG/5ML PO SUSP: 30.0000 mL | Freq: Four times a day (QID) | ORAL | Status: DC | PRN

## 2012-11-13 MED ORDER — FAMOTIDINE 10 MG PO TABS
10.0000 mg | ORAL_TABLET | Freq: Two times a day (BID) | ORAL | Status: DC
  Administered 2012-11-13 – 2012-11-14 (×3): 10 mg via ORAL
  Filled 2012-11-13 (×4): qty 1

## 2012-11-13 NOTE — Progress Notes (Addendum)
[facility] PROGRESS NOTE  [name] ZOX:[id number] DOB: July 18, 1940 DOA: 11/12/2012 PCP: Samuel Jester, DO Primary Cardiologist: Dr. Newt Lukes.  Brief narrative 72 year old male patient with extensive past medical history-CAD, CABG x2, multiple stents, ischemic cardiomyopathy, AICD, chronic systolic CHF, cardiac arrest, recurrent VTE, on Coumadin (was briefly held and resumed one week ago), CVA,? A. fib, HTN, DM 2, CKD, recent GI bleeding (apparently small bowel source by capsule study and cauterized) required multiple PRBCs and FFP, ongoing tobacco (pipe) abuse presented with left-sided chest pain on 8/23 PM, radiating to left side of back and medial aspect of left arm to the fingers, nonpleuritic, lasted a few hours, improved after sublingual NTG x2 and subsequently resolved. The pain was associated with diaphoresis and some dyspnea. He had mild similar pain 2 days prior too. He was admitted for further evaluation.  Assessment/Plan: 1. Chest pain/unstable angina/CAD/CABG x2: EKG without acute changes. Troponins negative. VQ scan negative. Chest pain resolved. Cardiology input appreciated. Followup 2-D echo. 2. Mild acute on chronic systolic CHF/ischemic cardiomyopathy/AICD: Continue IV Lasix. Follow 2-D echo results. EF 2011 was 20-25%. No ACEI/ARB due to renal insufficiency. 3. COPD: Stable. Tobacco cessation counseled. 4. Hypertension: Controlled. Continue carvedilol. 5. DM 2: Uncontrolled. Start low dose Levemir and continue SSI. 6. 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 7. Stage III chronic kidney disease: Baseline creatinine not known. Follow BMP in am. 8. Anemia and chronic thrombocytopenia: Stable compared to labs since last night. No features of overt bleeding. Follow CBC in a.m. 9. Recent upper GI bleed: Continue H2 blockers. No Plavix due to recent GI bleed.  Code Status: Full Family Communication: None Disposition Plan: Due to significant cardiac history,  continue to manage in the step down unit.   Consultants:  Cardiology  Procedures:  None  Antibiotics:  None   HPI/Subjective: Denies chest pain or dyspnea.  Objective: Filed Vitals:   11/13/12 0600 11/13/12 0630 11/13/12 0800 11/13/12 1200  BP: 98/57 114/62 135/62 120/71  Pulse: 80 81 82 66  Temp:   97.9 F (36.6 C) 97.8 F (36.6 C)  TempSrc:      Resp: 16  24 19   Height:      Weight:      SpO2: 96% 99% 99% 100%    Intake/Output Summary (Last 24 hours) at 11/13/12 1354 Last data filed at 11/13/12 1200  Gross per 24 hour  Intake 721.75 ml  Output    425 ml  Net 296.75 ml   Filed Weights   11/13/12 0333  Weight: 88.5 kg (195 lb 1.7 oz)    Exam:   General exam: Comfortable.  Respiratory system: Clear. No increased work of breathing.  Cardiovascular system: S1 & S2 heard, RRR. No JVD, murmurs, gallops, clicks or pedal edema. Telemetry: Sinus rhythm with occasional PAC's  Gastrointestinal system: Abdomen is nondistended, soft and nontender. Normal bowel sounds heard.  Central nervous system: Alert and oriented. No focal neurological deficits.  Extremities: Symmetric 5 x 5 power.   Data Reviewed: Basic Metabolic Panel:  Recent Labs Lab 11/12/12 2318 11/13/12 0500  NA 144 140  K 3.9 4.1  CL 106 106  CO2  --  26  GLUCOSE 235* 237*  BUN 29* 30*  CREATININE 1.90* 2.04*  CALCIUM  --  8.7   Liver Function Tests: No results found for this basename: AST, ALT, ALKPHOS, BILITOT, PROT, ALBUMIN,  in the last 168 hours No results found for this basename: LIPASE, AMYLASE,  in the last 168 hours No results found  for this basename: AMMONIA,  in the last 168 hours CBC:  Recent Labs Lab 11/12/12 2307 11/12/12 2318 11/13/12 0500  WBC 6.1  --  7.1  HGB 8.6* 9.5* 8.6*  HCT 27.4* 28.0* 28.5*  MCV 86.4  --  88.0  PLT 98*  --  97*   Cardiac Enzymes:  Recent Labs Lab 11/13/12 0130 11/13/12 0734  TROPONINI <0.30 <0.30   BNP (last 3  results)  Recent Labs  11/12/12 2311  PROBNP 986.7*   CBG:  Recent Labs Lab 11/13/12 0305 11/13/12 0732 11/13/12 1221  GLUCAP 190* 207* 191*    Recent Results (from the past 240 hour(s))  MRSA PCR SCREENING     Status: None   Collection Time    11/13/12  3:28 AM      Result Value Range Status   MRSA by PCR NEGATIVE  NEGATIVE Final   Comment:            The GeneXpert MRSA Assay (FDA     approved for NASAL specimens     only), is one component of a     comprehensive MRSA colonization     surveillance program. It is not     intended to diagnose MRSA     infection nor to guide or     monitor treatment for     MRSA infections.     Studies: Nm Pulmonary Perf And Vent  11/13/2012   *RADIOLOGY REPORT*  Clinical Data:  Pleuritic chest pain.  Shortness of breath.  Acute renal insufficiency (creatinine 2.0, estimated GFR 31) precluded IV contrast for CTA chest.  NUCLEAR MEDICINE VENTILATION - PERFUSION LUNG SCAN  Technique:  Ventilation images were obtained in multiple projections using inhaled aerosol technetium 99 M DTPA.  Perfusion images were obtained in multiple projections after intravenous injection of Tc-51m MAA.  Radiopharmaceuticals:  Tc-49m DTPA aerosol and 6 mCi Tc-21m MAA.  Comparison: No prior nuclear imaging.  Portable chest x-ray yesterday.  Findings:  Ventilation:  No localized ventilatory defect.  Photopenia related to the battery generator for the indwelling pacing defibrillator. Mild clumping of the DTPA aerosol in the central bronchi.  Perfusion:  Normal perfusion.  No segmental or subsegmental defects to suggest pulmonary embolism.  IMPRESSION: Normal examination.   Original Report Authenticated By: Hulan Saas, M.D.   Dg Chest Portable 1 View  11/12/2012   *RADIOLOGY REPORT*  Clinical Data: Chest pain for 2 days.  History of previous cardiac surgery.  PORTABLE CHEST - 1 VIEW  Comparison: 10/28/2012  Findings: Stable appearance of postoperative changes in  the mediastinum and cardiac pacemaker.  Cardiac enlargement with normal pulmonary vascularity.  No focal consolidation or airspace disease in the lungs.  No blunting of costophrenic angles.  No pneumothorax.  Mediastinal contours appear intact.  Calcification of the aorta.  Degenerative changes in the shoulders.  No significant change since previous study.  IMPRESSION: Cardiac enlargement without significant vascular congestion or infiltration.   Original Report Authenticated By: Burman Nieves, M.D.     Additional labs:   Scheduled Meds: . allopurinol  100 mg Oral Daily  . carvedilol  3.125 mg Oral BID WC  . cholecalciferol  1,000 Units Oral Daily  . furosemide  40 mg Intravenous Q12H  . insulin aspart  0-9 Units Subcutaneous Q4H  . ramipril  2.5 mg Oral Daily  . simvastatin  40 mg Oral QPM  . sodium chloride  3 mL Intravenous Q12H  . sodium chloride  3 mL Intravenous Q12H  .  vitamin C  500 mg Oral Daily  . warfarin  5 mg Oral q1800  . Warfarin - Physician Dosing Inpatient   Does not apply q1800   Continuous Infusions: . sodium chloride    . nitroGLYCERIN Stopped (11/13/12 1610)    Principal Problem:   Chest pain Active Problems:   SOB (shortness of breath)   COPD (chronic obstructive pulmonary disease)   Hypertension   PVD (peripheral vascular disease)   Warfarin-induced coagulopathy   Acute on chronic systolic CHF (congestive heart failure)    Time spent: 45 minutes.    [facility]  [facility] Pager [phone].   If 8PM-8AM, please contact night-coverage at [email or web address], password [facility] 11/13/2012, 1:54 PM  LOS: 1 day

## 2012-11-13 NOTE — Progress Notes (Addendum)
Placed pt. On cpap.as per order. Pt. Is tolerating well at this time. Pt. Stated his pressure at home was 11cmh20. Rt placed pt. As per home settings. RN is aware of pt. On cpap.

## 2012-11-13 NOTE — H&P (Addendum)
[facility] History and Physical  [name] UJW:[id number] DOB: 1940/07/16 DOA: 11/12/2012  Referring physician: EDP PCP: No primary provider on file.  Specialists:   Chief Complaint: Left sided Chest Pain  HPI: [name] is a 72 y.o. male who presents to the ED with complaints of severe 10/10 pleuritic chest pain on the left side of his chest worse since 7:30 pm but had been intermittent for the past 3 days.  He reports having SOB but denies having nausea or vomiting or diaphoresis.  He reports having mild relief after he had been given SL NTG.   The pain decreased to a 3/10.   He has allergies to Aspirin  So he was not given Aspirin.  Of note he had been on Coumadin therapy for a DVT but had a Gi bleed recently and was restarted on his coumadin 1 week ago.   He also has an allergy to IV contrast so a CTA of the Chest could not be performed overnight and he will be sent for a V/Q scan in the AM to evaluated for a possible PE.     Review of Systems: The patient denies anorexia, fever, chills, headaches, weight loss, vision loss, diplopia, dizziness, decreased hearing, rhinitis, hoarseness, syncope, dyspnea on exertion, peripheral edema, balance deficits, cough, hemoptysis, abdominal pain, nausea, vomiting, diarrhea, constipation, hematemesis, melena, hematochezia, severe indigestion/heartburn, dysuria, hematuria, incontinence, muscle weakness, suspicious skin lesions, transient blindness, difficulty walking, depression, unusual weight change, abnormal bleeding, enlarged lymph nodes, angioedema, and breast masses.    Past Medical History  Diagnosis Date  . CVA (cerebral vascular accident)   . Diabetes mellitus   . Hypertension   . COPD (chronic obstructive pulmonary disease)   . TIA (transient ischemic attack)   . Presence of cardiac defibrillator   . PVD (peripheral vascular disease)     Past Surgical History  Procedure Laterality Date  . Bypass graft      (2) Triple  CABG, 5 known stents in place. Pt has ICD with pacemaker in place.     Prior to Admission medications   Medication Sig Start Date End Date Taking? Authorizing Provider  allopurinol (ZYLOPRIM) 100 MG tablet Take 100 mg by mouth daily.   Yes Historical Provider, MD  carvedilol (COREG) 3.125 MG tablet Take 3.125 mg by mouth 2 (two) times daily with a meal.   Yes Historical Provider, MD  cholecalciferol (VITAMIN D) 1000 UNITS tablet Take 1,000 Units by mouth daily.   Yes Historical Provider, MD  insulin detemir (LEVEMIR) 100 UNIT/ML injection Inject 0-15 Units into the skin 3 (three) times daily.   Yes Historical Provider, MD  ranitidine (ZANTAC) 75 MG tablet Take 75 mg by mouth 2 (two) times daily.   Yes Historical Provider, MD  simvastatin (ZOCOR) 40 MG tablet Take 40 mg by mouth every evening.   Yes Historical Provider, MD  temazepam (RESTORIL) 15 MG capsule Take 15 mg by mouth at bedtime.   Yes Historical Provider, MD  vitamin C (ASCORBIC ACID) 500 MG tablet Take 500 mg by mouth daily.   Yes Historical Provider, MD  warfarin (COUMADIN) 5 MG tablet Take 5 mg by mouth daily.   Yes Historical Provider, MD    Allergies  Allergen Reactions  . Aspirin Anaphylaxis  . Ivp Dye [Iodinated Diagnostic Agents]       Social History:  does not have a smoking history on file. He uses smokeless tobacco. His alcohol and drug histories are not on file.  Family History:    CAD in  Mother, Brother, and Maternal grandfather and Maternal grandmother.     Leukemia in Father   Physical Exam:  GEN:  Pleasant Elderly well nourished and well developed  72 y.o. Caucasian male  examined  and in no acute distress; cooperative with exam Filed Vitals:   11/12/12 2315 11/12/12 2347 11/13/12 0015 11/13/12 0115  BP: 114/61 132/62 116/68 128/68  Pulse: 70 80 75 72  Temp:  98.8 F (37.1 C)    TempSrc:  Oral    Resp: 12 14 17 16   SpO2: 100% 100% 100% 100%   Blood pressure 128/68, pulse 72, temperature  98.8 F (37.1 C), temperature source Oral, resp. rate 16, SpO2 100.00%. PSYCH: He is alert and oriented x4; does not appear anxious does not appear depressed; affect is normal HEENT: Normocephalic and Atraumatic, Mucous membranes pink; PERRLA; EOM intact; Fundi:  Benign;  No scleral icterus, Nares: Patent, Oropharynx: Clear,  Fair Dentition, Neck:  FROM, no cervical lymphadenopathy nor thyromegaly or carotid bruit; no JVD; Breasts:: Not examined CHEST WALL: No tenderness CHEST: Normal respiration, clear to auscultation bilaterally HEART: Regular rate and rhythm; no murmurs rubs or gallops BACK: No kyphosis or scoliosis; no CVA tenderness ABDOMEN: Positive Bowel Sounds,  Obese, soft non-tender; no masses, no organomegaly. Rectal Exam: Not done EXTREMITIES: No cyanosis, clubbing or edema; no ulcerations. Genitalia: not examined PULSES: 2+ and symmetric SKIN: Normal hydration no rash or ulceration CNS: Cranial nerves 2-12 grossly intact no focal neurologic deficit    Labs on Admission:  Basic Metabolic Panel:  Recent Labs Lab 11/12/12 2318  NA 144  K 3.9  CL 106  GLUCOSE 235*  BUN 29*  CREATININE 1.90*   Liver Function Tests: No results found for this basename: AST, ALT, ALKPHOS, BILITOT, PROT, ALBUMIN,  in the last 168 hours No results found for this basename: LIPASE, AMYLASE,  in the last 168 hours No results found for this basename: AMMONIA,  in the last 168 hours CBC:  Recent Labs Lab 11/12/12 2307 11/12/12 2318  WBC 6.1  --   HGB 8.6* 9.5*  HCT 27.4* 28.0*  MCV 86.4  --   PLT 98*  --    Cardiac Enzymes: No results found for this basename: CKTOTAL, CKMB, CKMBINDEX, TROPONINI,  in the last 168 hours  BNP (last 3 results)  Recent Labs  11/12/12 2311  PROBNP 986.7*   CBG: No results found for this basename: GLUCAP,  in the last 168 hours  Radiological Exams on Admission: Dg Chest Portable 1 View  11/12/2012   *RADIOLOGY REPORT*  Clinical Data: Chest pain  for 2 days.  History of previous cardiac surgery.  PORTABLE CHEST - 1 VIEW  Comparison: 10/28/2012  Findings: Stable appearance of postoperative changes in the mediastinum and cardiac pacemaker.  Cardiac enlargement with normal pulmonary vascularity.  No focal consolidation or airspace disease in the lungs.  No blunting of costophrenic angles.  No pneumothorax.  Mediastinal contours appear intact.  Calcification of the aorta.  Degenerative changes in the shoulders.  No significant change since previous study.  IMPRESSION: Cardiac enlargement without significant vascular congestion or infiltration.   Original Report Authenticated By: Burman Nieves, M.D.     EKG: Independently reviewed. Paced Rhythm,  No Acute Changes    Assessment/Plan Principal Problem:   Pleuritic chest pain Active Problems:   SOB (shortness of breath)   COPD (chronic obstructive pulmonary disease)   Hypertension   PVD (peripheral vascular disease)  Warfarin-induced coagulopathy    Acute Versus Chronic CHF.     DM 2    1.   Pleuritic Chest pain - Rule out PE and ACS, admitted to Stepdown Bed, placed on and IV NTG drip. Cycle troponins, He is currently Therapeutic on his Coumadin at this time.   O2 PRN.    2.   SOB due to #1.  O2 PRN.    3.  CAD- hx , continue on carvedilol, and simvastatin.     4.   COPD- Albuterol Nebs PRN.    5.   HTN- continue carvedilol.  IV hydralazine PRN.    6.   DM2-  Continue Levemir and addd SSI coverage PRN, check HbA1c.    7.   PVD- stable  8.   Acute Versus Chronic CHF-  BNP =  986.7,  Place on CHF protocol and diurese with IV Lasix, 2 -D ECHO ordered (last 2-D ECHO listed in 2011 with EF = 20-25%).      9.  Coagulopathy due to Warfarin Rx-  Monitor PT/INR daily adjust PRN.       Code Status:    FULL CODE Family Communication:    Wife and Family at Bedside Disposition Plan:       Inpatient  Time spent:  75 Minutes  Ron Parker [facility] Pager  [phone]  If 7PM-7AM, please contact night-coverage [email or web address] Password [facility] 11/13/2012, 2:04 AM

## 2012-11-13 NOTE — Progress Notes (Signed)
Utilization review completed.  

## 2012-11-13 NOTE — Consult Note (Signed)
Reason for Consult: Chest pain Referring Physician: [facility]  NATALIO SALOIS is an 72 y.o. male.  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 Chest pain associated with shortness of breath. Denies any hemoptysis. Denies any palpitation lightheadedness or syncope. Patient does give history of PND and leg swelling. States had ICD discharge her in 2013 subsequently had revision of ICD at [facility]. Patient states he is being actively followed by Dr. Adella Hare at [facility] and wishes to be transferred there tomorrow if possible once MI is ruled out  Past Medical History  Diagnosis Date  . CVA (cerebral vascular accident)   . Diabetes mellitus   . Hypertension   . COPD (chronic obstructive pulmonary disease)   . TIA (transient ischemic attack)   . Presence of cardiac defibrillator   . PVD (peripheral vascular disease)     Past Surgical History  Procedure Laterality Date  . Bypass graft      (2) Triple CABG, 5 known stents in place. Pt has ICD with pacemaker in place.     Family History  Problem Relation Age of Onset  . Leukemia Father   . CAD Mother   . CAD Maternal Grandfather   . CAD Maternal Grandmother   . CAD Brother     Social  History:  does not have a smoking history on file. He uses smokeless tobacco. His alcohol and drug histories are not on file.  Allergies:  Allergies  Allergen Reactions  . Aspirin Anaphylaxis  . Ivp Dye [Iodinated Diagnostic Agents]     Medications: I have reviewed the patient's current medications.  Results for orders placed during the hospital encounter of 11/12/12 (from the past 48 hour(s))  CBC     Status: Abnormal   Collection Time    11/12/12 11:07 PM      Result Value Range   WBC 6.1  4.0 - 10.5 K/uL   RBC 3.17 (*) 4.22 - 5.81 MIL/uL   Hemoglobin 8.6 (*) 13.0 - 17.0 g/dL   HCT 60.4 (*) 54.0 - 98.1 %   MCV 86.4  78.0 - 100.0 fL   MCH 27.1  26.0 - 34.0 pg   MCHC 31.4  30.0 - 36.0 g/dL   RDW 19.1 (*) 47.8 - 29.5 %   Platelets 98 (*) 150 - 400 K/uL   Comment: PLATELET COUNT CONFIRMED BY SMEAR     REPEATED TO VERIFY  PROTIME-INR     Status: Abnormal   Collection Time    11/12/12 11:07 PM      Result Value Range   Prothrombin Time 26.0 (*) 11.6 - 15.2 seconds   INR 2.48 (*) 0.00 - 1.49  PRO B NATRIURETIC PEPTIDE     Status: Abnormal   Collection Time    11/12/12 11:11 PM      Result  Value Range   Pro B Natriuretic peptide (BNP) 986.7 (*) 0 - 125 pg/mL  POCT I-STAT TROPONIN I     Status: None   Collection Time    11/12/12 11:17 PM      Result Value Range   Troponin i, poc 0.03  0.00 - 0.08 ng/mL   Comment 3            Comment: Due to the release kinetics of cTnI,     a negative result within the first hours     of the onset of symptoms does not rule out     myocardial infarction with certainty.     If myocardial infarction is still suspected,     repeat the test at appropriate intervals.  POCT I-STAT, CHEM 8     Status: Abnormal   Collection Time    11/12/12 11:18 PM      Result Value Range   Sodium 144  135 - 145 mEq/L   Potassium 3.9  3.5 - 5.1 mEq/L   Chloride 106  96 - 112 mEq/L   BUN 29 (*) 6 - 23 mg/dL   Creatinine, Ser 4.09 (*) 0.50 - 1.35 mg/dL    Glucose, Bld 811 (*) 70 - 99 mg/dL   Calcium, Ion 9.14 (*) 1.13 - 1.30 mmol/L   TCO2 25  0 - 100 mmol/L   Hemoglobin 9.5 (*) 13.0 - 17.0 g/dL   HCT 78.2 (*) 95.6 - 21.3 %  OCCULT BLOOD, POC DEVICE     Status: None   Collection Time    11/13/12 12:12 AM      Result Value Range   Fecal Occult Bld NEGATIVE  NEGATIVE  TROPONIN I     Status: None   Collection Time    11/13/12  1:30 AM      Result Value Range   Troponin I <0.30  <0.30 ng/mL   Comment:            Due to the release kinetics of cTnI,     a negative result within the first hours     of the onset of symptoms does not rule out     myocardial infarction with certainty.     If myocardial infarction is still suspected,     repeat the test at appropriate intervals.  GLUCOSE, CAPILLARY     Status: Abnormal   Collection Time    11/13/12  3:05 AM      Result Value Range   Glucose-Capillary 190 (*) 70 - 99 mg/dL   Comment 1 Notify RN    MRSA PCR SCREENING     Status: None   Collection Time    11/13/12  3:28 AM      Result Value Range   MRSA by PCR NEGATIVE  NEGATIVE   Comment:            The GeneXpert MRSA Assay (FDA     approved for NASAL specimens     only), is one component of a     comprehensive MRSA colonization     surveillance program. It is not     intended to diagnose MRSA     infection nor to guide or     monitor treatment for     MRSA infections.  BASIC METABOLIC PANEL     Status: Abnormal   Collection Time    11/13/12  5:00 AM      Result Value Range   Sodium 140  135 -  145 mEq/L   Potassium 4.1  3.5 - 5.1 mEq/L   Chloride 106  96 - 112 mEq/L   CO2 26  19 - 32 mEq/L   Glucose, Bld 237 (*) 70 - 99 mg/dL   BUN 30 (*) 6 - 23 mg/dL   Creatinine, Ser 1.61 (*) 0.50 - 1.35 mg/dL   Calcium 8.7  8.4 - 09.6 mg/dL   GFR calc non Af Amer 31 (*) >90 mL/min   GFR calc Af Amer 36 (*) >90 mL/min   Comment: (NOTE)     The eGFR has been calculated using the CKD EPI equation.     This calculation has not been  validated in all clinical situations.     eGFR's persistently <90 mL/min signify possible Chronic Kidney     Disease.  CBC     Status: Abnormal   Collection Time    11/13/12  5:00 AM      Result Value Range   WBC 7.1  4.0 - 10.5 K/uL   RBC 3.24 (*) 4.22 - 5.81 MIL/uL   Hemoglobin 8.6 (*) 13.0 - 17.0 g/dL   HCT 04.5 (*) 40.9 - 81.1 %   MCV 88.0  78.0 - 100.0 fL   MCH 26.5  26.0 - 34.0 pg   MCHC 30.2  30.0 - 36.0 g/dL   RDW 91.4 (*) 78.2 - 95.6 %   Platelets 97 (*) 150 - 400 K/uL   Comment: CONSISTENT WITH PREVIOUS RESULT  PROTIME-INR     Status: Abnormal   Collection Time    11/13/12  5:00 AM      Result Value Range   Prothrombin Time 26.6 (*) 11.6 - 15.2 seconds   INR 2.55 (*) 0.00 - 1.49  GLUCOSE, CAPILLARY     Status: Abnormal   Collection Time    11/13/12  7:32 AM      Result Value Range   Glucose-Capillary 207 (*) 70 - 99 mg/dL   Comment 1 Notify RN    TROPONIN I     Status: None   Collection Time    11/13/12  7:34 AM      Result Value Range   Troponin I <0.30  <0.30 ng/mL   Comment:            Due to the release kinetics of cTnI,     a negative result within the first hours     of the onset of symptoms does not rule out     myocardial infarction with certainty.     If myocardial infarction is still suspected,     repeat the test at appropriate intervals.    Nm Pulmonary Perf And Vent  11/13/2012   *RADIOLOGY REPORT*  Clinical Data:  Pleuritic chest pain.  Shortness of breath.  Acute renal insufficiency (creatinine 2.0, estimated GFR 31) precluded IV contrast for CTA chest.  NUCLEAR MEDICINE VENTILATION - PERFUSION LUNG SCAN  Technique:  Ventilation images were obtained in multiple projections using inhaled aerosol technetium 99 M DTPA.  Perfusion images were obtained in multiple projections after intravenous injection of Tc-22m MAA.  Radiopharmaceuticals:  Tc-42m DTPA aerosol and 6 mCi Tc-79m MAA.  Comparison: No prior nuclear imaging.  Portable chest x-ray  yesterday.  Findings:  Ventilation:  No localized ventilatory defect.  Photopenia related to the battery generator for the indwelling pacing defibrillator. Mild clumping of the DTPA aerosol in the central bronchi.  Perfusion:  Normal perfusion.  No segmental or subsegmental defects to suggest pulmonary  embolism.  IMPRESSION: Normal examination.   Original Report Authenticated By: Hulan Saas, M.D.   Dg Chest Portable 1 View  11/12/2012   *RADIOLOGY REPORT*  Clinical Data: Chest pain for 2 days.  History of previous cardiac surgery.  PORTABLE CHEST - 1 VIEW  Comparison: 10/28/2012  Findings: Stable appearance of postoperative changes in the mediastinum and cardiac pacemaker.  Cardiac enlargement with normal pulmonary vascularity.  No focal consolidation or airspace disease in the lungs.  No blunting of costophrenic angles.  No pneumothorax.  Mediastinal contours appear intact.  Calcification of the aorta.  Degenerative changes in the shoulders.  No significant change since previous study.  IMPRESSION: Cardiac enlargement without significant vascular congestion or infiltration.   Original Report Authenticated By: Burman Nieves, M.D.    Review of Systems  Constitutional: Negative for fever and chills.  Eyes: Negative for blurred vision and double vision.  Respiratory: Positive for shortness of breath. Negative for hemoptysis and sputum production.   Cardiovascular: Positive for chest pain and leg swelling. Negative for palpitations and orthopnea.  Gastrointestinal: Negative for nausea, vomiting and abdominal pain.  Genitourinary: Negative for dysuria.  Neurological: Negative for headaches.   Blood pressure 135/62, pulse 82, temperature 97.9 F (36.6 C), temperature source Oral, resp. rate 24, height 5\' 8"  (1.727 m), weight 88.5 kg (195 lb 1.7 oz), SpO2 99.00%. Physical Exam  Constitutional: He is oriented to person, place, and time.  HENT:  Head: Normocephalic and atraumatic.  Eyes:  Conjunctivae are normal. Left eye exhibits no discharge. No scleral icterus.  Neck: Neck supple. No JVD present. No tracheal deviation present. No thyromegaly present.  Cardiovascular: Normal rate and regular rhythm.   Murmur (2/6 systolic murmur and soft S3 gallop noted) heard. Respiratory: Effort normal and breath sounds normal. No respiratory distress. He has no wheezes. He has no rales.  GI: Soft. Bowel sounds are normal. He exhibits no distension. There is no tenderness.  Musculoskeletal: He exhibits no edema and no tenderness.  Neurological: He is alert and oriented to person, place, and time.    Assessment/Plan: Unstable angina rule out MI Coronary artery disease history of MIs in the past status post CABG x2 and multiple PCI's in the past Ischemic cardiomyopathy status post ICD Mild decompensated systolic heart failure  Hypertension Diabetes mellitus Hypercholesteremia History of cardiogenic multiple CVAs in the past Questionable history of atrial fibrillation in the past History of recent GI bleeding Chronic kidney disease stage IV Plan Check serial cardiac enzymes and EKG Agree with present management Check old records consider starting Plavix if no contraindication from GI point of view as patient is allergic to aspirin  Check 2-D echo Patient wishes to be treated at [facility] as he had extensive cardiac workup done there. Had low-dose nitrates and ACE inhibitors. Monitor renal function closely  Corrinne Benegas N 11/13/2012, 12:14 PM

## 2012-11-13 NOTE — Progress Notes (Signed)
  Echocardiogram 2D Echocardiogram has been performed.  [name] FRANCES 11/13/2012, 5:02 PM

## 2012-11-14 DIAGNOSIS — I509 Heart failure, unspecified: Secondary | ICD-10-CM

## 2012-11-14 DIAGNOSIS — R079 Chest pain, unspecified: Secondary | ICD-10-CM

## 2012-11-14 DIAGNOSIS — I5023 Acute on chronic systolic (congestive) heart failure: Secondary | ICD-10-CM

## 2012-11-14 LAB — BASIC METABOLIC PANEL
BUN: 34 mg/dL — ABNORMAL HIGH (ref 6–23)
Calcium: 8.5 mg/dL (ref 8.4–10.5)
Creatinine, Ser: 2.07 mg/dL — ABNORMAL HIGH (ref 0.50–1.35)
GFR calc Af Amer: 35 mL/min — ABNORMAL LOW (ref >90)
GFR calc non Af Amer: 30 mL/min — ABNORMAL LOW (ref >90)

## 2012-11-14 LAB — CBC
HCT: 27.1 % — ABNORMAL LOW (ref 39.0–52.0)
MCHC: 31 g/dL (ref 30.0–36.0)
Platelets: 83 10*3/uL — ABNORMAL LOW (ref 150–400)
RDW: 19.2 % — ABNORMAL HIGH (ref 11.5–15.5)
WBC: 5.6 10*3/uL (ref 4.0–10.5)

## 2012-11-14 LAB — PROTIME-INR
INR: 2.38 — ABNORMAL HIGH (ref 0.00–1.49)
Prothrombin Time: 25.2 seconds — ABNORMAL HIGH (ref 11.6–15.2)

## 2012-11-14 LAB — GLUCOSE, CAPILLARY: Glucose-Capillary: 225 mg/dL — ABNORMAL HIGH (ref 70–99)

## 2012-11-14 MED ORDER — INSULIN DETEMIR 100 UNIT/ML ~~LOC~~ SOLN: 15.0000 [IU] | Freq: Every day | SUBCUTANEOUS | Status: DC

## 2012-11-14 MED ORDER — RAMIPRIL 2.5 MG PO CAPS: 2.5000 mg | ORAL_CAPSULE | Freq: Every day | ORAL | Status: DC

## 2012-11-14 MED ORDER — FUROSEMIDE 10 MG/ML IJ SOLN: 40.0000 mg | Freq: Two times a day (BID) | INTRAMUSCULAR | Status: DC

## 2012-11-14 NOTE — Progress Notes (Signed)
Utilization review completed.  

## 2012-11-14 NOTE — Progress Notes (Signed)
Pt transferred to [facility] per MD order. Report called to receiving nurse and all questions answered. Report also called to care link.

## 2012-11-14 NOTE — Progress Notes (Signed)
Subjective:  Patient complains of sharp chest pain localized associated with jaw pain and shortness of breath yesterday. No further chest pain or shortness of breath today feels much better  Objective:  Vital Signs in the last 24 hours: Temp:  [97.8 F (36.6 C)-98.8 F (37.1 C)] 98.7 F (37.1 C) (08/25 0746) Pulse Rate:  [66-85] 77 (08/25 0746) Resp:  [13-29] 19 (08/25 0746) BP: (92-120)/(45-71) 92/61 mmHg (08/25 0746) SpO2:  [96 %-100 %] 99 % (08/25 0746) Weight:  [89.3 kg (196 lb 13.9 oz)] 89.3 kg (196 lb 13.9 oz) (08/25 0319)  Intake/Output from previous day: 08/24 0701 - 08/25 0700 In: 1360 [P.O.:1360] Out: 1925 [Urine:1925] Intake/Output from this shift: Total I/O In: -  Out: 300 [Urine:300]  Physical Exam: Neck: no adenopathy, no carotid bruit, no JVD and supple, symmetrical, trachea midline Lungs: clear to auscultation bilaterally Heart: regular rate and rhythm, S1, S2 normal and Soft systolic murmur at S3 gallop noted Abdomen: soft, non-tender; bowel sounds normal; no masses,  no organomegaly Extremities: extremities normal, atraumatic, no cyanosis or edema  Lab Results:  Recent Labs  11/13/12 0500 11/14/12 0600  WBC 7.1 5.6  HGB 8.6* 8.4*  PLT 97* 83*    Recent Labs  11/13/12 0500 11/14/12 0600  NA 140 137  K 4.1 3.6  CL 106 102  CO2 26 25  GLUCOSE 237* 228*  BUN 30* 34*  CREATININE 2.04* 2.07*    Recent Labs  11/13/12 0734 11/13/12 1325  TROPONINI <0.30 <0.30   Hepatic Function Panel No results found for this basename: PROT, ALBUMIN, AST, ALT, ALKPHOS, BILITOT, BILIDIR, IBILI,  in the last 72 hours No results found for this basename: CHOL,  in the last 72 hours No results found for this basename: PROTIME,  in the last 72 hours  Imaging: Imaging results have been reviewed and Nm Pulmonary Perf And Vent  11/13/2012   *RADIOLOGY REPORT*  Clinical Data:  Pleuritic chest pain.  Shortness of breath.  Acute renal insufficiency (creatinine 2.0,  estimated GFR 31) precluded IV contrast for CTA chest.  NUCLEAR MEDICINE VENTILATION - PERFUSION LUNG SCAN  Technique:  Ventilation images were obtained in multiple projections using inhaled aerosol technetium 99 M DTPA.  Perfusion images were obtained in multiple projections after intravenous injection of Tc-7m MAA.  Radiopharmaceuticals:  Tc-31m DTPA aerosol and 6 mCi Tc-9m MAA.  Comparison: No prior nuclear imaging.  Portable chest x-ray yesterday.  Findings:  Ventilation:  No localized ventilatory defect.  Photopenia related to the battery generator for the indwelling pacing defibrillator. Mild clumping of the DTPA aerosol in the central bronchi.  Perfusion:  Normal perfusion.  No segmental or subsegmental defects to suggest pulmonary embolism.  IMPRESSION: Normal examination.   Original Report Authenticated By: Hulan Saas, M.D.   Dg Chest Portable 1 View  11/12/2012   *RADIOLOGY REPORT*  Clinical Data: Chest pain for 2 days.  History of previous cardiac surgery.  PORTABLE CHEST - 1 VIEW  Comparison: 10/28/2012  Findings: Stable appearance of postoperative changes in the mediastinum and cardiac pacemaker.  Cardiac enlargement with normal pulmonary vascularity.  No focal consolidation or airspace disease in the lungs.  No blunting of costophrenic angles.  No pneumothorax.  Mediastinal contours appear intact.  Calcification of the aorta.  Degenerative changes in the shoulders.  No significant change since previous study.  IMPRESSION: Cardiac enlargement without significant vascular congestion or infiltration.   Original Report Authenticated By: Burman Nieves, M.D.    Cardiac Studies:  Assessment/Plan:  Stable angina MI ruled out Coronary artery disease history of MIs in the past status post CABG x2 and multiple PCI's in the past  Ischemic cardiomyopathy status post ICD  Mild decompensated systolic heart failure  Hypertension  Diabetes mellitus  Hypercholesteremia  History of  cardiogenic multiple CVAs in the past  Questionable history of atrial fibrillation in the past  History of recent GI bleeding  Chronic kidney disease stage IV  Plan Continue present management Okay to transfer to [facility] hospital to his primary cardiologist  LOS: 2 days    Takeyah Wieman N 11/14/2012, 10:26 AM

## 2012-11-14 NOTE — Discharge Summary (Signed)
Physician Discharge Summary  TRAVES MAJCHRZAK ZOX:[id number] DOB: 1940-12-15 DOA: 11/12/2012  PCP: Samuel Jester, DO  Admit date: 11/12/2012 Discharge date: 11/14/2012  Time spent: Greater than 30 minutes  Recommendations for Outpatient Follow-up:  1. Dr. Samuel Jester, PCP in 2 weeks. 2. Dr. Newt Lukes, Cardiology 3. Patient is being transferred to [facility] and further DC instructions will be made there. Accepting M.D.: Dr. Altamese Dilling Patient will go to Rm [id number] at [facility].  Discharge Diagnoses:  Principal Problem:   Chest pain Active Problems:   SOB (shortness of breath)   COPD (chronic obstructive pulmonary disease)   Hypertension   PVD (peripheral vascular disease)   Warfarin-induced coagulopathy   Acute on chronic systolic CHF (congestive heart failure)   Discharge Condition: Improved & Stable  Diet recommendation: Heart healthy and diabetic diet the  [facility] Weights   11/13/12 0333 11/14/12 0319  Weight: 88.5 kg (195 lb 1.7 oz) 89.3 kg (196 lb 13.9 oz)    History of present illness:  72 year old male patient with extensive past medical history-CAD, CABG x2, multiple stents, ischemic cardiomyopathy, AICD, chronic systolic CHF, cardiac arrest, recurrent VTE, on Coumadin (was briefly held and resumed one week ago), CVA,? A. fib, HTN, DM 2, CKD, OSA on CPAP, recent GI bleeding (apparently small bowel source by capsule study and cauterized) required multiple PRBCs and FFP, ongoing tobacco (pipe) abuse presented with left-sided chest pain on 8/23 PM, radiating to left side of back and medial aspect of left arm to the fingers, nonpleuritic, lasted a few hours, improved after sublingual NTG x2 and subsequently resolved. The pain was associated with diaphoresis and some dyspnea. He had mild similar pain 2 days prior too. He was admitted for further evaluation.   Hospital Course:  1. Chest pain/unstable angina/CAD/CABG x2: Patient was admitted to the step down unit. EKG  did not show acute changes. Troponins negative. VQ scan negative. Cardiology was consulted and recommended stress test but patient wished to be transferred to [facility] to his primary cardiologist for further evaluation. Cardiology has started patient on low dose ACE inhibitor. 2-D echo results are as below but shows LVEF 20-25% with severe hypokinesis. Patient has allergy to aspirin. Plavix was held a few weeks back secondary to significant GI bleed. Cardiology has cleared patient for discharge and transferred to [facility] for further evaluation by his primary cardiology team. Patient had a brief episode of left-sided sharp chest pain with associated dyspnea last night (lasted 5-6 seconds). No further chest pain since. 2. Mild acute on chronic systolic CHF/ischemic cardiomyopathy/AICD: Continue IV Lasix. EF 2011 was 20-25%. 2-D echo results as below and EF has not really changed since 2011. Cardiology has started patient on low dose ACE inhibitors-however given history of chronic kidney disease, his renal functions will need close monitoring. 3. COPD: Stable. Tobacco cessation counseled.  4. Hypertension: Controlled. Continue carvedilol. Low dose ACE inhibitors added newly during this hospitalization. 5. DM 2: Uncontrolled. Patient was on sliding scale insulin alone at home. His blood sugars in the hospital however when uncontrolled. He was started on Levemir 10 units daily yesterday but fasting CBG was 225. Will increase Levemir to 15 units daily. 6. Recurrent VTE/PAF: Anticoagulated on Coumadin.  7. Stage III chronic kidney disease: Baseline creatinine not known. Creatinine has been stable over the last 48 hours. Monitor closely while patient on IV Lasix (patient was not on diuretics PTA) & ACE inhibitors (new medication) 8. Anemia and chronic thrombocytopenia: Stable. No features  of overt bleeding.  9. Recent upper GI bleed: Continue H2 blockers. No Plavix due to recent GI  bleed 10. OSA: Continue nightly CPAP.   Procedures:  None   Consultations:  Cardiology  Discharge Exam:  Complaints: Patient had a brief episode of sharp left-sided chest pain with associated dyspnea last night. No chest pain or dyspnea since.  Filed Vitals:   11/13/12 2133 11/13/12 2342 11/14/12 0319 11/14/12 0746  BP:  98/51 96/45 92/61   Pulse: 81 73 71 77  Temp:  98.8 F (37.1 C) 98.1 F (36.7 C) 98.7 F (37.1 C)  TempSrc:  Oral Oral Oral  Resp: 21 21 13 19   Height:      Weight:   89.3 kg (196 lb 13.9 oz)   SpO2: 97% 100% 100% 99%     General exam: Comfortable.  Respiratory system: Clear. No increased work of breathing.  Cardiovascular system: S1 and S2 heard, RRR. No JVD, murmurs or pedal edema.  Gastrointestinal system: Abdomen is nondistended, soft and nontender. Normal bowel sounds heard. Telemetry: Mostly sinus rhythm with occasional PVCs. Patient has paroxysmal A. fib and paced rhythm.  Central nervous system: Alert and oriented. No focal neurological deficits.  Extremities: Symmetric 5 x 5 power.  Discharge Instructions      Discharge Orders   Future Orders Complete By Expires   (HEART FAILURE PATIENTS) Call MD:  Anytime you have any of the following symptoms: 1) 3 pound weight gain in 24 hours or 5 pounds in 1 week 2) shortness of breath, with or without a dry hacking cough 3) swelling in the hands, feet or stomach 4) if you have to sleep on extra pillows at night in order to breathe.  As directed    Call MD for:  difficulty breathing, headache or visual disturbances  As directed    Call MD for:  severe uncontrolled pain  As directed    Diet - low sodium heart healthy  As directed    Diet Carb Modified  As directed    Discharge instructions  As directed    Comments:     Continue CPAP at bedtime.   Increase activity slowly  As directed        Medication List         allopurinol 100 MG tablet  Commonly known as:  ZYLOPRIM  Take 100 mg by  mouth daily.     carvedilol 3.125 MG tablet  Commonly known as:  COREG  Take 3.125 mg by mouth 2 (two) times daily with a meal.     cholecalciferol 1000 UNITS tablet  Commonly known as:  VITAMIN D  Take 1,000 Units by mouth daily.     furosemide 10 MG/ML injection  Commonly known as:  LASIX  Inject 4 mLs (40 mg total) into the vein every 12 (twelve) hours.     insulin aspart 100 UNIT/ML injection  Commonly known as:  novoLOG  Inject 0-15 Units into the skin 3 (three) times daily with meals.     insulin detemir 100 UNIT/ML injection  Commonly known as:  LEVEMIR  Inject 0.15 mLs (15 Units total) into the skin daily.     ramipril 2.5 MG capsule  Commonly known as:  ALTACE  Take 1 capsule (2.5 mg total) by mouth daily.     ranitidine 75 MG tablet  Commonly known as:  ZANTAC  Take 75 mg by mouth 2 (two) times daily.     simvastatin 40 MG tablet  Commonly known as:  ZOCOR  Take 40 mg by mouth every evening.     temazepam 15 MG capsule  Commonly known as:  RESTORIL  Take 15 mg by mouth at bedtime.     vitamin C 500 MG tablet  Commonly known as:  ASCORBIC ACID  Take 500 mg by mouth daily.     warfarin 5 MG tablet  Commonly known as:  COUMADIN  Take 5 mg by mouth daily.       Follow-up Information   Follow up with BUTLER, CYNTHIA, DO. Schedule an appointment as soon as possible for a visit in 2 weeks.   Contact information:   [address] [place] [place] [address] [phone]       Schedule an appointment as soon as possible for a visit with APPLEGATE,ROBERT, MD.   Specialty:  Internal Medicine   Contact information:   [address] [place] [address]        The results of significant diagnostics from this hospitalization (including imaging, microbiology, ancillary and laboratory) are listed below for reference.    Significant Diagnostic Studies: Nm Pulmonary Perf And Vent  11/13/2012   *RADIOLOGY REPORT*  Clinical Data:  Pleuritic chest  pain.  Shortness of breath.  Acute renal insufficiency (creatinine 2.0, estimated GFR 31) precluded IV contrast for CTA chest.  NUCLEAR MEDICINE VENTILATION - PERFUSION LUNG SCAN  Technique:  Ventilation images were obtained in multiple projections using inhaled aerosol technetium 99 M DTPA.  Perfusion images were obtained in multiple projections after intravenous injection of Tc-33m MAA.  Radiopharmaceuticals:  Tc-54m DTPA aerosol and 6 mCi Tc-59m MAA.  Comparison: No prior nuclear imaging.  Portable chest x-ray yesterday.  Findings:  Ventilation:  No localized ventilatory defect.  Photopenia related to the battery generator for the indwelling pacing defibrillator. Mild clumping of the DTPA aerosol in the central bronchi.  Perfusion:  Normal perfusion.  No segmental or subsegmental defects to suggest pulmonary embolism.  IMPRESSION: Normal examination.   Original Report Authenticated By: Hulan Saas, M.D.   Dg Chest Portable 1 View  11/12/2012   *RADIOLOGY REPORT*  Clinical Data: Chest pain for 2 days.  History of previous cardiac surgery.  PORTABLE CHEST - 1 VIEW  Comparison: 10/28/2012  Findings: Stable appearance of postoperative changes in the mediastinum and cardiac pacemaker.  Cardiac enlargement with normal pulmonary vascularity.  No focal consolidation or airspace disease in the lungs.  No blunting of costophrenic angles.  No pneumothorax.  Mediastinal contours appear intact.  Calcification of the aorta.  Degenerative changes in the shoulders.  No significant change since previous study.  IMPRESSION: Cardiac enlargement without significant vascular congestion or infiltration.   Original Report Authenticated By: Burman Nieves, M.D.    Microbiology: Recent Results (from the past 240 hour(s))  MRSA PCR SCREENING     Status: None   Collection Time    11/13/12  3:28 AM      Result Value Range Status   MRSA by PCR NEGATIVE  NEGATIVE Final   Comment:            The GeneXpert MRSA Assay  (FDA     approved for NASAL specimens     only), is one component of a     comprehensive MRSA colonization     surveillance program. It is not     intended to diagnose MRSA     infection nor to guide or     monitor treatment for     MRSA infections.  Labs: Basic Metabolic Panel:  Recent Labs Lab 11/12/12 2318 11/13/12 0500 11/14/12 0600  NA 144 140 137  K 3.9 4.1 3.6  CL 106 106 102  CO2  --  26 25  GLUCOSE 235* 237* 228*  BUN 29* 30* 34*  CREATININE 1.90* 2.04* 2.07*  CALCIUM  --  8.7 8.5   Liver Function Tests: No results found for this basename: AST, ALT, ALKPHOS, BILITOT, PROT, ALBUMIN,  in the last 168 hours No results found for this basename: LIPASE, AMYLASE,  in the last 168 hours No results found for this basename: AMMONIA,  in the last 168 hours CBC:  Recent Labs Lab 11/12/12 2307 11/12/12 2318 11/13/12 0500 11/14/12 0600  WBC 6.1  --  7.1 5.6  HGB 8.6* 9.5* 8.6* 8.4*  HCT 27.4* 28.0* 28.5* 27.1*  MCV 86.4  --  88.0 86.9  PLT 98*  --  97* 83*   Cardiac Enzymes:  Recent Labs Lab 11/13/12 0130 11/13/12 0734 11/13/12 1325  TROPONINI <0.30 <0.30 <0.30   BNP: BNP (last 3 results)  Recent Labs  11/12/12 2311  PROBNP 986.7*   CBG:  Recent Labs Lab 11/13/12 0732 11/13/12 1221 11/13/12 1632 11/13/12 2126 11/14/12 0820  GLUCAP 207* 191* 213* 247* 225*    Additional labs:  INR: 2.38  Hemoglobin A1c: 6.7  FOBT 11/13/12: Negative  2-D echo Study Conclusions  - Left ventricle: The cavity size was mildly dilated. Systolic function was severely reduced. The estimated ejection fraction was in the range of 20% to 25%. There is severe hypokinesis. - Aortic valve: There was mild stenosis. Valve area: 1.44cm^2(VTI). Valve area: 1.52cm^2 (Vmax). - Mitral valve: Mild to moderate regurgitation. - Left atrium: The atrium was mildly dilated. - Atrial septum: No defect or patent foramen ovale was identified. - Pulmonary arteries: Systolic  pressure was moderately increased. PA peak pressure: 58mm Hg (S).   Signed:  Tresten Pantoja  [facility] 11/14/2012, 11:12 AM

## 2014-06-12 ENCOUNTER — Encounter (HOSPITAL_COMMUNITY): Payer: Self-pay | Admitting: *Deleted

## 2014-06-12 NOTE — Progress Notes (Addendum)
Patient reports that DM is managed by Dr Orlena Sheldon is PCP, Insulin has been changed since [place] was 13 in Dec. 2015 at [facility].  I have [facility] [place] (patient reports that he had labs drawn in the past 2 weeks at [place]. Mr Maddocks reported that the last fasting CBG he did was 212.  I faxed a request to [facility] with Device orders, requesting for them to be completed and returned.  I called [employer] and had M. Amedeo Plenty notified of surgery date and time.  Mr Ginley stated that he does not take Keppra, "they could not prove that I had a seizuire , so I don't take them."     Patient reported that Dr Caralyn Guile told him that he could have liquids until 0830.

## 2014-06-13 ENCOUNTER — Ambulatory Visit (HOSPITAL_COMMUNITY): Payer: Medicare Other | Admitting: Anesthesiology

## 2014-06-13 ENCOUNTER — Ambulatory Visit (HOSPITAL_COMMUNITY)
Admission: RE | Admit: 2014-06-13 | Discharge: 2014-06-13 | Disposition: A | Discharge status: Home / Self Care | Payer: Medicare Other | Source: Ambulatory Visit | Attending: Orthopedic Surgery | Admitting: Orthopedic Surgery

## 2014-06-13 ENCOUNTER — Encounter (HOSPITAL_COMMUNITY)
Admission: RE | Disposition: A | Discharge status: Home / Self Care | Payer: Self-pay | Source: Ambulatory Visit | Attending: Orthopedic Surgery

## 2014-06-13 ENCOUNTER — Encounter (HOSPITAL_COMMUNITY): Payer: Self-pay | Admitting: *Deleted

## 2014-06-13 DIAGNOSIS — I129 Hypertensive chronic kidney disease with stage 1 through stage 4 chronic kidney disease, or unspecified chronic kidney disease: Secondary | ICD-10-CM | POA: Diagnosis not present

## 2014-06-13 DIAGNOSIS — S66821A Laceration of other specified muscles, fascia and tendons at wrist and hand level, right hand, initial encounter: Secondary | ICD-10-CM | POA: Insufficient documentation

## 2014-06-13 DIAGNOSIS — Z794 Long term (current) use of insulin: Secondary | ICD-10-CM | POA: Diagnosis not present

## 2014-06-13 DIAGNOSIS — E119 Type 2 diabetes mellitus without complications: Secondary | ICD-10-CM | POA: Insufficient documentation

## 2014-06-13 DIAGNOSIS — N189 Chronic kidney disease, unspecified: Secondary | ICD-10-CM | POA: Insufficient documentation

## 2014-06-13 DIAGNOSIS — Z95810 Presence of automatic (implantable) cardiac defibrillator: Secondary | ICD-10-CM | POA: Diagnosis not present

## 2014-06-13 DIAGNOSIS — Z886 Allergy status to analgesic agent status: Secondary | ICD-10-CM | POA: Diagnosis not present

## 2014-06-13 DIAGNOSIS — S66822A Laceration of other specified muscles, fascia and tendons at wrist and hand level, left hand, initial encounter: Secondary | ICD-10-CM | POA: Diagnosis present

## 2014-06-13 DIAGNOSIS — I509 Heart failure, unspecified: Secondary | ICD-10-CM | POA: Insufficient documentation

## 2014-06-13 DIAGNOSIS — Z79899 Other long term (current) drug therapy: Secondary | ICD-10-CM | POA: Insufficient documentation

## 2014-06-13 DIAGNOSIS — Z8673 Personal history of transient ischemic attack (TIA), and cerebral infarction without residual deficits: Secondary | ICD-10-CM | POA: Diagnosis not present

## 2014-06-13 DIAGNOSIS — M109 Gout, unspecified: Secondary | ICD-10-CM | POA: Insufficient documentation

## 2014-06-13 DIAGNOSIS — I739 Peripheral vascular disease, unspecified: Secondary | ICD-10-CM | POA: Insufficient documentation

## 2014-06-13 DIAGNOSIS — Y929 Unspecified place or not applicable: Secondary | ICD-10-CM | POA: Diagnosis not present

## 2014-06-13 DIAGNOSIS — J449 Chronic obstructive pulmonary disease, unspecified: Secondary | ICD-10-CM | POA: Diagnosis not present

## 2014-06-13 DIAGNOSIS — E785 Hyperlipidemia, unspecified: Secondary | ICD-10-CM | POA: Insufficient documentation

## 2014-06-13 DIAGNOSIS — I2510 Atherosclerotic heart disease of native coronary artery without angina pectoris: Secondary | ICD-10-CM | POA: Diagnosis not present

## 2014-06-13 DIAGNOSIS — W540XXA Bitten by dog, initial encounter: Secondary | ICD-10-CM | POA: Diagnosis not present

## 2014-06-13 DIAGNOSIS — K219 Gastro-esophageal reflux disease without esophagitis: Secondary | ICD-10-CM | POA: Insufficient documentation

## 2014-06-13 DIAGNOSIS — Z91041 Radiographic dye allergy status: Secondary | ICD-10-CM | POA: Diagnosis not present

## 2014-06-13 DIAGNOSIS — Z87891 Personal history of nicotine dependence: Secondary | ICD-10-CM | POA: Insufficient documentation

## 2014-06-13 DIAGNOSIS — Z86718 Personal history of other venous thrombosis and embolism: Secondary | ICD-10-CM | POA: Diagnosis not present

## 2014-06-13 DIAGNOSIS — S61452A Open bite of left hand, initial encounter: Secondary | ICD-10-CM | POA: Diagnosis not present

## 2014-06-13 HISTORY — DX: Chronic kidney disease, unspecified: N18.9

## 2014-06-13 HISTORY — DX: Personal history of other medical treatment: Z92.89

## 2014-06-13 HISTORY — DX: Gastro-esophageal reflux disease without esophagitis: K21.9

## 2014-06-13 HISTORY — DX: Acute embolism and thrombosis of unspecified deep veins of unspecified lower extremity: I82.409

## 2014-06-13 HISTORY — DX: Sleep apnea, unspecified: G47.30

## 2014-06-13 HISTORY — PX: I&D EXTREMITY: SHX5045

## 2014-06-13 HISTORY — DX: Heart failure, unspecified: I50.9

## 2014-06-13 HISTORY — DX: Hyperlipidemia, unspecified: E78.5

## 2014-06-13 HISTORY — DX: Presence of cardiac pacemaker: Z95.0

## 2014-06-13 HISTORY — DX: Reserved for inherently not codable concepts without codable children: IMO0001

## 2014-06-13 HISTORY — DX: Atherosclerotic heart disease of native coronary artery without angina pectoris: I25.10

## 2014-06-13 HISTORY — DX: Presence of automatic (implantable) cardiac defibrillator: Z95.810

## 2014-06-13 HISTORY — DX: Gout, unspecified: M10.9

## 2014-06-13 LAB — BASIC METABOLIC PANEL
Anion gap: 9 (ref 5–15)
BUN: 36 mg/dL — AB (ref 6–23)
CHLORIDE: 102 mmol/L (ref 96–112)
CO2: 25 mmol/L (ref 19–32)
CREATININE: 2.06 mg/dL — AB (ref 0.50–1.35)
Calcium: 8.4 mg/dL (ref 8.4–10.5)
GFR calc non Af Amer: 30 mL/min — ABNORMAL LOW (ref >90)
GFR, EST AFRICAN AMERICAN: 35 mL/min — AB (ref >90)
GLUCOSE: 174 mg/dL — AB (ref 70–99)
Potassium: 3.8 mmol/L (ref 3.5–5.1)
Sodium: 136 mmol/L (ref 135–145)

## 2014-06-13 LAB — GLUCOSE, CAPILLARY
GLUCOSE-CAPILLARY: 158 mg/dL — AB (ref 70–99)
Glucose-Capillary: 179 mg/dL — ABNORMAL HIGH (ref 70–99)

## 2014-06-13 LAB — APTT: aPTT: 49 seconds — ABNORMAL HIGH (ref 24–37)

## 2014-06-13 LAB — CBC
HEMATOCRIT: 38.1 % — AB (ref 39.0–52.0)
HEMOGLOBIN: 13.5 g/dL (ref 13.0–17.0)
MCH: 31.5 pg (ref 26.0–34.0)
MCHC: 35.4 g/dL (ref 30.0–36.0)
MCV: 88.8 fL (ref 78.0–100.0)
Platelets: 92 10*3/uL — ABNORMAL LOW (ref 150–400)
RBC: 4.29 MIL/uL (ref 4.22–5.81)
RDW: 13.7 % (ref 11.5–15.5)
WBC: 9.6 10*3/uL (ref 4.0–10.5)

## 2014-06-13 LAB — PROTIME-INR
INR: 2.55 — AB (ref 0.00–1.49)
Prothrombin Time: 27.6 seconds — ABNORMAL HIGH (ref 11.6–15.2)

## 2014-06-13 SURGERY — IRRIGATION AND DEBRIDEMENT EXTREMITY
Anesthesia: Monitor Anesthesia Care | Site: Hand | Laterality: Left

## 2014-06-13 MED ORDER — HYDROMORPHONE HCL 1 MG/ML IJ SOLN: 0.2500 mg | INTRAMUSCULAR | Status: DC | PRN

## 2014-06-13 MED ORDER — LIDOCAINE HCL (CARDIAC) 20 MG/ML IV SOLN
INTRAVENOUS | Status: AC
  Filled 2014-06-13: qty 5

## 2014-06-13 MED ORDER — FENTANYL CITRATE 0.05 MG/ML IJ SOLN
INTRAMUSCULAR | Status: AC
  Administered 2014-06-13: 25 ug via INTRAVENOUS
  Filled 2014-06-13: qty 2

## 2014-06-13 MED ORDER — FENTANYL CITRATE 0.05 MG/ML IJ SOLN
INTRAMUSCULAR | Status: AC
  Filled 2014-06-13: qty 5

## 2014-06-13 MED ORDER — KETAMINE HCL 10 MG/ML IJ SOLN
INTRAMUSCULAR | Status: DC | PRN
  Administered 2014-06-13 (×2): .1 mg via INTRAVENOUS

## 2014-06-13 MED ORDER — CHLORHEXIDINE GLUCONATE 4 % EX LIQD
60.0000 mL | Freq: Once | CUTANEOUS | Status: DC
  Filled 2014-06-13: qty 60

## 2014-06-13 MED ORDER — CLINDAMYCIN PHOSPHATE 900 MG/50ML IV SOLN
900.0000 mg | INTRAVENOUS | Status: AC
  Administered 2014-06-13: 900 mg via INTRAVENOUS
  Filled 2014-06-13: qty 50

## 2014-06-13 MED ORDER — DOCUSATE SODIUM 100 MG PO CAPS: 100.0000 mg | ORAL_CAPSULE | Freq: Two times a day (BID) | ORAL | Status: DC

## 2014-06-13 MED ORDER — LACTATED RINGERS IV SOLN
INTRAVENOUS | Status: DC
  Administered 2014-06-13: 50 mL/h via INTRAVENOUS

## 2014-06-13 MED ORDER — OXYCODONE-ACETAMINOPHEN 10-325 MG PO TABS: 1.0000 | ORAL_TABLET | ORAL | Status: DC | PRN

## 2014-06-13 MED ORDER — MIDAZOLAM HCL 2 MG/2ML IJ SOLN
INTRAMUSCULAR | Status: AC
  Filled 2014-06-13: qty 2

## 2014-06-13 MED ORDER — PROPOFOL INFUSION 10 MG/ML OPTIME
INTRAVENOUS | Status: DC | PRN
  Administered 2014-06-13: 100 ug/kg/min via INTRAVENOUS

## 2014-06-13 MED ORDER — BUPIVACAINE-EPINEPHRINE (PF) 0.5% -1:200000 IJ SOLN
INTRAMUSCULAR | Status: DC | PRN
  Administered 2014-06-13: 30 mL via PERINEURAL

## 2014-06-13 MED ORDER — FENTANYL CITRATE 0.05 MG/ML IJ SOLN
50.0000 ug | INTRAMUSCULAR | Status: DC | PRN
  Administered 2014-06-13: 25 ug via INTRAVENOUS

## 2014-06-13 MED ORDER — FENTANYL CITRATE 0.05 MG/ML IJ SOLN
INTRAMUSCULAR | Status: DC | PRN
  Administered 2014-06-13 (×2): 50 ug via INTRAVENOUS

## 2014-06-13 MED ORDER — MIDAZOLAM HCL 2 MG/2ML IJ SOLN
INTRAMUSCULAR | Status: AC
  Administered 2014-06-13: 0.5 mg via INTRAVENOUS
  Filled 2014-06-13: qty 2

## 2014-06-13 MED ORDER — MIDAZOLAM HCL 2 MG/2ML IJ SOLN
1.0000 mg | INTRAMUSCULAR | Status: DC | PRN
  Administered 2014-06-13: 0.5 mg via INTRAVENOUS

## 2014-06-13 MED ORDER — MIDAZOLAM HCL 5 MG/5ML IJ SOLN
INTRAMUSCULAR | Status: DC | PRN
  Administered 2014-06-13: 1 mg via INTRAVENOUS

## 2014-06-13 MED ORDER — ONDANSETRON HCL 4 MG/2ML IJ SOLN
INTRAMUSCULAR | Status: AC
  Filled 2014-06-13: qty 2

## 2014-06-13 MED ORDER — 0.9 % SODIUM CHLORIDE (POUR BTL) OPTIME
TOPICAL | Status: DC | PRN
  Administered 2014-06-13: 1000 mL

## 2014-06-13 MED ORDER — PROMETHAZINE HCL 25 MG/ML IJ SOLN: 6.2500 mg | INTRAMUSCULAR | Status: DC | PRN

## 2014-06-13 MED ORDER — KETAMINE HCL 100 MG/ML IJ SOLN
INTRAMUSCULAR | Status: AC
Start: 2014-06-13 — End: 2014-06-13
  Filled 2014-06-13: qty 1

## 2014-06-13 SURGICAL SUPPLY — 48 items
BANDAGE ELASTIC 3 VELCRO ST LF (GAUZE/BANDAGES/DRESSINGS) ×3 IMPLANT
BANDAGE ELASTIC 4 VELCRO ST LF (GAUZE/BANDAGES/DRESSINGS) IMPLANT
BNDG COHESIVE 1X5 TAN STRL LF (GAUZE/BANDAGES/DRESSINGS) IMPLANT
BNDG ESMARK 4X9 LF (GAUZE/BANDAGES/DRESSINGS) ×3 IMPLANT
BNDG GAUZE ELAST 4 BULKY (GAUZE/BANDAGES/DRESSINGS) ×3 IMPLANT
CORDS BIPOLAR (ELECTRODE) ×3 IMPLANT
COVER SURGICAL LIGHT HANDLE (MISCELLANEOUS) ×3 IMPLANT
CUFF TOURNIQUET SINGLE 18IN (TOURNIQUET CUFF) ×3 IMPLANT
CUFF TOURNIQUET SINGLE 24IN (TOURNIQUET CUFF) IMPLANT
DRAPE SURG 17X23 STRL (DRAPES) ×3 IMPLANT
DRSG ADAPTIC 3X8 NADH LF (GAUZE/BANDAGES/DRESSINGS) IMPLANT
GAUZE SPONGE 2X2 8PLY STRL LF (GAUZE/BANDAGES/DRESSINGS) IMPLANT
GAUZE SPONGE 4X4 12PLY STRL (GAUZE/BANDAGES/DRESSINGS) IMPLANT
GAUZE XEROFORM 5X9 LF (GAUZE/BANDAGES/DRESSINGS) ×3 IMPLANT
GLOVE BIOGEL PI IND STRL 8.5 (GLOVE) ×1 IMPLANT
GLOVE BIOGEL PI INDICATOR 8.5 (GLOVE) ×2
GLOVE SURG ORTHO 8.0 STRL STRW (GLOVE) ×3 IMPLANT
GOWN STRL REUS W/ TWL LRG LVL3 (GOWN DISPOSABLE) ×2 IMPLANT
GOWN STRL REUS W/ TWL XL LVL3 (GOWN DISPOSABLE) ×1 IMPLANT
GOWN STRL REUS W/TWL LRG LVL3 (GOWN DISPOSABLE) ×4
GOWN STRL REUS W/TWL XL LVL3 (GOWN DISPOSABLE) ×2
KIT BASIN OR (CUSTOM PROCEDURE TRAY) ×3 IMPLANT
KIT ROOM TURNOVER OR (KITS) ×3 IMPLANT
MANIFOLD NEPTUNE II (INSTRUMENTS) IMPLANT
NEEDLE HYPO 25GX1X1/2 BEV (NEEDLE) IMPLANT
NS IRRIG 1000ML POUR BTL (IV SOLUTION) ×3 IMPLANT
PACK ORTHO EXTREMITY (CUSTOM PROCEDURE TRAY) ×3 IMPLANT
PAD ARMBOARD 7.5X6 YLW CONV (MISCELLANEOUS) ×6 IMPLANT
PAD CAST 4YDX4 CTTN HI CHSV (CAST SUPPLIES) IMPLANT
PADDING CAST ABS 3INX4YD NS (CAST SUPPLIES) ×2
PADDING CAST ABS COTTON 3X4 (CAST SUPPLIES) ×1 IMPLANT
PADDING CAST COTTON 4X4 STRL (CAST SUPPLIES)
SOAP 2 % CHG 4 OZ (WOUND CARE) ×3 IMPLANT
SPECIMEN JAR SMALL (MISCELLANEOUS) ×3 IMPLANT
SPONGE GAUZE 2X2 STER 10/PKG (GAUZE/BANDAGES/DRESSINGS)
SPONGE GAUZE 4X4 12PLY STER LF (GAUZE/BANDAGES/DRESSINGS) ×3 IMPLANT
SUCTION FRAZIER TIP 10 FR DISP (SUCTIONS) IMPLANT
SUT MERSILENE 4 0 P 3 (SUTURE) IMPLANT
SUT PROLENE 4 0 PS 2 18 (SUTURE) ×6 IMPLANT
SUT VIC AB 2-0 CT1 27 (SUTURE)
SUT VIC AB 2-0 CT1 TAPERPNT 27 (SUTURE) IMPLANT
SYR CONTROL 10ML LL (SYRINGE) IMPLANT
TOWEL OR 17X24 6PK STRL BLUE (TOWEL DISPOSABLE) ×3 IMPLANT
TOWEL OR 17X26 10 PK STRL BLUE (TOWEL DISPOSABLE) ×3 IMPLANT
TUBE CONNECTING 12'X1/4 (SUCTIONS)
TUBE CONNECTING 12X1/4 (SUCTIONS) IMPLANT
UNDERPAD 30X30 INCONTINENT (UNDERPADS AND DIAPERS) ×3 IMPLANT
WATER STERILE IRR 1000ML POUR (IV SOLUTION) IMPLANT

## 2014-06-13 NOTE — H&P (Signed)
[name] is an 74 y.o. male.   Chief Complaint: left hand dog bite HPI: pt sustained dog bite to left hand Pt seen/evaluated in office Pt with dog bite and open wound and unable to flex long finger Pt here for surgery No prior surgery to left hand  Past Medical History  Diagnosis Date  . Hypertension   . COPD (chronic obstructive pulmonary disease)   . TIA (transient ischemic attack)   . Presence of cardiac defibrillator   . PVD (peripheral vascular disease)   . CHF (congestive heart failure)   . Hyperlipemia   . Coronary artery disease   . Chronic kidney disease   . Gout   . Sleep apnea   . History of blood transfusion   . AICD (automatic cardioverter/defibrillator) present   . Presence of permanent cardiac pacemaker   . Shortness of breath dyspnea     with exertion  . Diabetes mellitus     TYpe II  . GERD (gastroesophageal reflux disease)   . CVA (cerebral vascular accident)     6 . no residual  . DVT (deep venous thrombosis)     Past Surgical History  Procedure Laterality Date  . Bypass graft      (2) Triple CABG, 5 known stents in place. Pt has ICD with pacemaker in place.   . Cholecystectomy    . Abdominal aortic aneurysm repair    . Colonoscopy w/ polypectomy    . Coronary artery bypass graft  1985, 2003    Family History  Problem Relation Age of Onset  . Leukemia Father   . CAD Mother   . CAD Maternal Grandfather   . CAD Maternal Grandmother   . CAD Brother    Social History:  reports that he has quit smoking. He has never used smokeless tobacco. He reports that he drinks alcohol. He reports that he does not use illicit drugs.  Allergies:  Allergies  Allergen Reactions  . Aspirin Anaphylaxis  . Ivp Dye [Iodinated Diagnostic Agents] Anaphylaxis    Blackout    Medications Prior to Admission  Medication Sig Dispense Refill  . allopurinol (ZYLOPRIM) 100 MG tablet Take 100 mg by mouth daily.    Marland Kitchen atorvastatin (LIPITOR) 80 MG tablet Take 80 mg  by mouth daily at 6 PM.    . carvedilol (COREG) 3.125 MG tablet Take 3.125 mg by mouth 2 (two) times daily with a meal.    . cholecalciferol (VITAMIN D) 1000 UNITS tablet Take 1,000 Units by mouth daily.    . furosemide (LASIX) 40 MG tablet Take 40 mg by mouth daily as needed for fluid (46m - 80 mg). Prn Leg Swelling    . HYDROcodone-acetaminophen (NORCO/VICODIN) 5-325 MG per tablet Take 1 tablet by mouth every 4 (four) hours as needed for moderate pain.    .Marland Kitcheninsulin aspart (NOVOLOG) 100 UNIT/ML injection Inject 0-15 Units into the skin 3 (three) times daily with meals.    . levETIRAcetam (KEPPRA) 500 MG tablet Take 500 mg by mouth 2 (two) times daily. Patient not taking    . lisinopril (PRINIVIL,ZESTRIL) 2.5 MG tablet Take 2.5 mg by mouth daily.    . ranitidine (ZANTAC) 75 MG tablet Take 75 mg by mouth 2 (two) times daily.    . ranolazine (RANEXA) 500 MG 12 hr tablet Take 500 mg by mouth 2 (two) times daily.    . temazepam (RESTORIL) 15 MG capsule Take 15 mg by mouth at bedtime.    . vitamin  C (ASCORBIC ACID) 500 MG tablet Take 500 mg by mouth daily.    . Vitamin D, Ergocalciferol, (DRISDOL) 50000 UNITS CAPS capsule Take 50,000 Units by mouth every 7 (seven) days.    Marland Kitchen warfarin (COUMADIN) 5 MG tablet Take 5 mg by mouth daily.    . calcium carbonate (TUMS - DOSED IN MG ELEMENTAL CALCIUM) 500 MG chewable tablet Chew 1 tablet by mouth 2 (two) times daily.    . furosemide (LASIX) 10 MG/ML injection Inject 4 mLs (40 mg total) into the vein every 12 (twelve) hours.    . insulin detemir (LEVEMIR) 100 UNIT/ML injection Inject 0.15 mLs (15 Units total) into the skin daily.    . isosorbide mononitrate (IMDUR) 30 MG 24 hr tablet Take 30 mg by mouth daily.    . nitroGLYCERIN (NITROSTAT) 0.4 MG SL tablet Place 0.4 mg under the tongue every 5 (five) minutes as needed for chest pain.    . ramipril (ALTACE) 2.5 MG capsule Take 1 capsule (2.5 mg total) by mouth daily.    . simvastatin (ZOCOR) 40 MG tablet Take  40 mg by mouth every evening.    . warfarin (COUMADIN) 5 MG tablet Take 5 mg by mouth daily.      Results for orders placed or performed during the hospital encounter of 06/13/14 (from the past 48 hour(s))  Glucose, capillary     Status: Abnormal   Collection Time: 06/13/14  1:52 PM  Result Value Ref Range   Glucose-Capillary 179 (H) 70 - 99 mg/dL  Basic metabolic panel     Status: Abnormal   Collection Time: 06/13/14  2:08 PM  Result Value Ref Range   Sodium 136 135 - 145 mmol/L   Potassium 3.8 3.5 - 5.1 mmol/L   Chloride 102 96 - 112 mmol/L   CO2 25 19 - 32 mmol/L   Glucose, Bld 174 (H) 70 - 99 mg/dL   BUN 36 (H) 6 - 23 mg/dL   Creatinine, Ser 2.06 (H) 0.50 - 1.35 mg/dL   Calcium 8.4 8.4 - 10.5 mg/dL   GFR calc non Af Amer 30 (L) >90 mL/min   GFR calc Af Amer 35 (L) >90 mL/min    Comment: (NOTE) The eGFR has been calculated using the CKD EPI equation. This calculation has not been validated in all clinical situations. eGFR's persistently <90 mL/min signify possible Chronic Kidney Disease.    Anion gap 9 5 - 15  CBC     Status: Abnormal   Collection Time: 06/13/14  2:08 PM  Result Value Ref Range   WBC 9.6 4.0 - 10.5 K/uL   RBC 4.29 4.22 - 5.81 MIL/uL   Hemoglobin 13.5 13.0 - 17.0 g/dL   HCT 38.1 (L) 39.0 - 52.0 %   MCV 88.8 78.0 - 100.0 fL   MCH 31.5 26.0 - 34.0 pg   MCHC 35.4 30.0 - 36.0 g/dL   RDW 13.7 11.5 - 15.5 %   Platelets 92 (L) 150 - 400 K/uL    Comment: REPEATED TO VERIFY SPECIMEN CHECKED FOR CLOTS PLATELET COUNT CONFIRMED BY SMEAR   Protime-INR     Status: Abnormal   Collection Time: 06/13/14  2:08 PM  Result Value Ref Range   Prothrombin Time 27.6 (H) 11.6 - 15.2 seconds   INR 2.55 (H) 0.00 - 1.49  APTT     Status: Abnormal   Collection Time: 06/13/14  2:08 PM  Result Value Ref Range   aPTT 49 (H) 24 - 37 seconds  Comment:        IF BASELINE aPTT IS ELEVATED, SUGGEST PATIENT RISK ASSESSMENT BE USED TO DETERMINE APPROPRIATE ANTICOAGULANT  THERAPY.   Glucose, capillary     Status: Abnormal   Collection Time: 06/13/14  4:12 PM  Result Value Ref Range   Glucose-Capillary 158 (H) 70 - 99 mg/dL   No results found.  ROS: NO RECENT ILLNESSES OR HOSPITALIZATIONS  Blood pressure 92/55, pulse 73, temperature 97.8 F (36.6 C), temperature source Oral, resp. rate 18, height 5' 7"  (1.702 m), weight 87.091 kg (192 lb), SpO2 93 %. Physical Exam  General Appearance:  Alert, cooperative, no distress, appears stated age  Head:  Normocephalic, without obvious abnormality, atraumatic  Eyes:  Pupils equal, conjunctiva/corneas clear,         Throat: Lips, mucosa, and tongue normal; teeth and gums normal  Neck: No visible masses     Lungs:   respirations unlabored  Chest Wall:  No tenderness or deformity  Heart:  Regular rate and rhythm,  Abdomen:   Soft, non-tender,         Extremities: LEFT HAND: IN VOLAR SPLINT WOUNDS AND FINGER EXAMINED IN OFFICE FINGERS WARM WELL PERFUSED   Pulses: 2+ and symmetric  Skin: Skin color, texture, turgor normal, no rashes or lesions     Neurologic: Normal   Assessment/Plan LEFT HAND DOG BITE WITH TENDON INVOLVEMENT  LEFT HAND IRRIGATION AND DEBRIDEMENT AND TENDON REPAIR AS INDICATED  R/B/A DISCUSSED WITH PT IN OFFICE.  PT VOICED UNDERSTANDING OF PLAN CONSENT SIGNED DAY OF SURGERY PT SEEN AND EXAMINED PRIOR TO OPERATIVE PROCEDURE/DAY OF SURGERY SITE MARKED. QUESTIONS ANSWERED WILL GO HOME FOLLOWING SURGERY  WE ARE PLANNING SURGERY FOR YOUR UPPER EXTREMITY. THE RISKS AND BENEFITS OF SURGERY INCLUDE BUT NOT LIMITED TO BLEEDING INFECTION, DAMAGE TO NEARBY NERVES ARTERIES TENDONS, FAILURE OF SURGERY TO ACCOMPLISH ITS INTENDED GOALS, PERSISTENT SYMPTOMS AND NEED FOR FURTHER SURGICAL INTERVENTION. WITH THIS IN MIND WE WILL PROCEED. I HAVE DISCUSSED WITH THE PATIENT THE PRE AND POSTOPERATIVE REGIMEN AND THE DOS AND DON'TS. PT VOICED UNDERSTANDING AND INFORMED CONSENT SIGNED.  Linna Hoff 06/13/2014, 4:42 PM

## 2014-06-13 NOTE — Progress Notes (Signed)
Orthopedic Tech Progress Note Patient Details:  [name] 10-14-1940 [id number]  Ortho Devices Type of Ortho Device: Arm sling Ortho Device/Splint Location: LUE Ortho Device/Splint Interventions: Ordered, Application   Braulio Bosch 06/13/2014, 6:49 PM

## 2014-06-13 NOTE — Brief Op Note (Signed)
06/13/2014  4:44 PM  PATIENT:  [name]  74 y.o. male  PRE-OPERATIVE DIAGNOSIS:  LEFT HAND LACERATION WITH TENDON INVOLVMENT   POST-OPERATIVE DIAGNOSIS:  * No post-op diagnosis entered *  PROCEDURE:  Procedure(s): LEFT HAND LACERATION WITH TENDON REPAIR (Left)  SURGEON:  Surgeon(s) and Role:    * Iran Planas, MD - Primary  PHYSICIAN ASSISTANT:   ASSISTANTS: none   ANESTHESIA:   regional  EBL:     BLOOD ADMINISTERED:none  DRAINS: none   LOCAL MEDICATIONS USED:  MARCAINE     SPECIMEN:  No Specimen  DISPOSITION OF SPECIMEN:  N/A  COUNTS:  YES  TOURNIQUET:    DICTATION: .315400  PLAN OF CARE: Discharge to home after PACU  PATIENT DISPOSITION:  PACU - hemodynamically stable.   Delay start of Pharmacological VTE agent (>24hrs) due to surgical blood loss or risk of bleeding: not applicable

## 2014-06-13 NOTE — Transfer of Care (Signed)
Immediate Anesthesia Transfer of Care Note  Patient: [name]  Procedure(s) Performed: Procedure(s): IRRIGATION AND DEBRIDEMENT Left Hand Wound (Left)  Patient Location: PACU  Anesthesia Type:MAC and Regional  Level of Consciousness: awake, alert  and oriented  Airway & Oxygen Therapy: Patient Spontanous Breathing and Patient connected to face mask oxygen  Post-op Assessment: Report given to RN and Post -op Vital signs reviewed and stable  Post vital signs: Reviewed and stable  Last Vitals:  Filed Vitals:   06/13/14 1650  BP: 117/61  Pulse: 71  Temp:   Resp: 19    Complications: No apparent anesthesia complications

## 2014-06-13 NOTE — Anesthesia Procedure Notes (Signed)
Anesthesia Regional Block:  Supraclavicular block  Pre-Anesthetic Checklist: ,, timeout performed, Correct Patient, Correct Site, Correct Laterality, Correct Procedure, Correct Position, site marked, Risks and benefits discussed,  Surgical consent,  Pre-op evaluation,  At surgeon's request and post-op pain management  Laterality: Left  Prep: chloraprep       Needles:  Injection technique: Single-shot  Needle Type: Echogenic Stimulator Needle     Needle Length: 9cm 9 cm Needle Gauge: 21 and 21 G    Additional Needles:  Procedures: ultrasound guided (picture in chart) and nerve stimulator Supraclavicular block Narrative:  Start time: 06/13/2014 4:30 PM End time: 06/13/2014 4:40 PM Injection made incrementally with aspirations every 5 mL.  Performed by: Personally  Anesthesiologist: Suzette Battiest  Additional Notes: Risks, benefits and alternative to block explained extensively.  Patient tolerated procedure well, without complications.

## 2014-06-13 NOTE — Progress Notes (Signed)
[employer] called, awaiting call back from rep regarding pt's ICD, emergency orders placed on chart.

## 2014-06-13 NOTE — Anesthesia Postprocedure Evaluation (Signed)
Anesthesia Post Note  Patient: [name]  Procedure(s) Performed: Procedure(s) (LRB): IRRIGATION AND DEBRIDEMENT Left Hand Wound (Left)  Anesthesia type: MAC  Patient location: PACU  Post pain: Pain level controlled and Adequate analgesia  Post assessment: Post-op Vital signs reviewed, Patient's Cardiovascular Status Stable and Respiratory Function Stable  Last Vitals:  Filed Vitals:   06/13/14 1800  BP: 92/48  Pulse: 70  Temp:   Resp: 14    Post vital signs: Reviewed and stable  Level of consciousness: awake, alert  and oriented  Complications: No apparent anesthesia complications

## 2014-06-13 NOTE — Anesthesia Preprocedure Evaluation (Addendum)
Anesthesia Evaluation  Patient identified by MRN, date of birth, ID band Patient awake    Reviewed: Allergy & Precautions, NPO status , Patient's Chart, lab work & pertinent test results  Airway Mallampati: II  TM Distance: >3 FB Neck ROM: Full    Dental  (+) Partial Upper   Pulmonary sleep apnea and Continuous Positive Airway Pressure Ventilation , COPDformer smoker,  breath sounds clear to auscultation        Cardiovascular hypertension, + CAD, + Past MI, + Cardiac Stents, + CABG, + Peripheral Vascular Disease and +CHF + pacemaker + Cardiac Defibrillator Rhythm:Regular Rate:Normal  02/2014 TTE: EF 15-20%, mild MR, Mild TR, mild pulm HTN.   Neuro/Psych TIACVA, Residual Symptoms    GI/Hepatic GERD-  ,  Endo/Other  diabetes, Type 2  Renal/GU CRFRenal disease     Musculoskeletal   Abdominal   Peds  Hematology   Anesthesia Other Findings   Reproductive/Obstetrics                           Anesthesia Physical Anesthesia Plan  ASA: IV  Anesthesia Plan: Regional and MAC   Post-op Pain Management:    Induction: Intravenous  Airway Management Planned: Simple Face Mask and Natural Airway  Additional Equipment:   Intra-op Plan:   Post-operative Plan:   Informed Consent: I have reviewed the patients History and Physical, chart, labs and discussed the procedure including the risks, benefits and alternatives for the proposed anesthesia with the patient or authorized representative who has indicated his/her understanding and acceptance.   Dental advisory given  Plan Discussed with: CRNA and Surgeon  Anesthesia Plan Comments:        Anesthesia Quick Evaluation

## 2014-06-13 NOTE — Progress Notes (Signed)
CRNA at bedside.

## 2014-06-13 NOTE — Progress Notes (Signed)
Tomi Bamberger with [employer] called back, informed of Dr. Blane Ohara orders to have ICD interrogated postop. Tomi Bamberger told this nurse to let PACU know to interrogate ICD which was done, spoke with Mickel Baas, RN in PACU.

## 2014-06-13 NOTE — Discharge Instructions (Signed)
KEEP BANDAGE CLEAN AND DRY CALL OFFICE FOR F/U APPT [phone] IN 9 DAYS CALL FOR THERAPY APPT TO FOLLOW [phone] EXT 1601 IN 9 DAYS KEEP HAND ELEVATED ABOVE HEART OK TO APPLY ICE TO OPERATIVE AREA CONTACT OFFICE IF ANY WORSENING PAIN OR CONCERNS.

## 2014-06-14 NOTE — Op Note (Signed)
NAMEVIDUR, KNUST NO.:  [id number]  MEDICAL RECORD NO.:  [id number]  LOCATION:  [facility]                         FACILITY:  [facility]  PHYSICIAN:  Melrose Nakayama, MD  DATE OF BIRTH:  12-17-1940  DATE OF PROCEDURE:  06/13/2014 DATE OF DISCHARGE:  06/13/2014                              OPERATIVE REPORT   PREOPERATIVE DIAGNOSIS:  Left hand dog bite.  POSTOPERATIVE DIAGNOSIS:  Left hand dog bite.  PROCEDURE: 1. Right long finger debridement of skin, subcutaneous tissue, and     muscle.  Left hand excisional debridement done sharply with knife     and scissors. 2. Left long finger flexor sheath drainage. 3. Left hand common digital nerve neuroplasty. 4. Left hand traumatic laceration, 3 cm.  ATTENDING SURGEON:  Melrose Nakayama, MD, who was scrubbed and present for the entire procedure.  ASSISTANT SURGEON:  None.  ANESTHESIA:  Supraclavicular block with IV sedation.  INTRAOPERATIVE FINDINGS:  The FDS and FDP were in continuity to the long finger.  SURGICAL INDICATION:  Mr. Delmer is a 74 year old right-hand dominant gentleman, who was bit by a dog.  The patient presented to the office with an obvious open wound to the volar aspect of the hand with inability to move the left long finger.  The patient was seen and evaluated and recommended to undergo the above procedure.  Risks, benefits, and alternatives were discussed in detail with the patient. Signed informed consent was obtained.  Risks include, but not limited to bleeding; infection; damage to nearby nerves, arteries, or tendons; loss of motion of wrist and digits, incomplete relief of symptoms; and need for further surgical intervention.  DESCRIPTION OF PROCEDURE:  The patient was properly identified in the preoperative holding area and marked with a permanent marker made on left hand to indicate the correct operative site.  The patient was brought back to the operating room, placed supine on  anesthesia room table, where the IV sedation was administered.  A well-padded tourniquet was placed on left brachium and sealed with 1000 drape.  Left upper extremity was then prepped and draped in normal sterile fashion.  Time- out was called.  Correct site was identified, and procedure then begun. Attention then turned to the left hand where the volar palmar bite in zone 3 of the flexor tendons was then extended both proximally and distally over the long finger.  The limb had been elevated and tourniquet insufflated.  Deep dissection carried down through the skin and subcutaneous tissue.  Excisional debridement was then carried out of the skin, subcutaneous tissues as well as portion of a lumbrical where the zone of injury was.  Neurovascular bundle, the common digital nerve to the long and ring finger web space was in continuity.  The artery was also in continuity.  Digital neuroplasty was then carried out of the common digital nerve in the hand.  The wound was then thoroughly irrigated.  The flexor sheath was then thoroughly irrigated and washed out.  Following this traumatic laceration, the extended excision was then closed with a simple Prolene suture.  Xeroform dressing and sterile compressive bandages were then applied.  The patient tolerated the procedure well.  Returned to recovery room in good condition.  POSTPROCEDURE PLAN:  The patient discharged home, seen back in the office in approximately 9 days for wound check, likely suture removal, and gradual use and activity.     Melrose Nakayama, MD     FWO/MEDQ  D:  06/13/2014  T:  06/14/2014  Job:  [id number]

## 2014-06-18 ENCOUNTER — Encounter (HOSPITAL_COMMUNITY): Payer: Self-pay | Admitting: Orthopedic Surgery

## 2015-01-05 ENCOUNTER — Emergency Department (HOSPITAL_COMMUNITY): Payer: Medicare Other

## 2015-01-05 ENCOUNTER — Emergency Department (HOSPITAL_COMMUNITY)
Admission: EM | Admit: 2015-01-05 | Discharge: 2015-01-05 | Payer: Medicare Other | Attending: Emergency Medicine | Admitting: Emergency Medicine

## 2015-01-05 ENCOUNTER — Encounter (HOSPITAL_COMMUNITY): Payer: Self-pay | Admitting: Emergency Medicine

## 2015-01-05 DIAGNOSIS — I509 Heart failure, unspecified: Secondary | ICD-10-CM | POA: Diagnosis not present

## 2015-01-05 DIAGNOSIS — Z951 Presence of aortocoronary bypass graft: Secondary | ICD-10-CM | POA: Diagnosis not present

## 2015-01-05 DIAGNOSIS — Z8669 Personal history of other diseases of the nervous system and sense organs: Secondary | ICD-10-CM | POA: Insufficient documentation

## 2015-01-05 DIAGNOSIS — K219 Gastro-esophageal reflux disease without esophagitis: Secondary | ICD-10-CM | POA: Diagnosis not present

## 2015-01-05 DIAGNOSIS — R4781 Slurred speech: Secondary | ICD-10-CM | POA: Diagnosis present

## 2015-01-05 DIAGNOSIS — F1310 Sedative, hypnotic or anxiolytic abuse, uncomplicated: Secondary | ICD-10-CM | POA: Diagnosis not present

## 2015-01-05 DIAGNOSIS — M109 Gout, unspecified: Secondary | ICD-10-CM | POA: Insufficient documentation

## 2015-01-05 DIAGNOSIS — Z86718 Personal history of other venous thrombosis and embolism: Secondary | ICD-10-CM | POA: Diagnosis not present

## 2015-01-05 DIAGNOSIS — Z79899 Other long term (current) drug therapy: Secondary | ICD-10-CM | POA: Diagnosis not present

## 2015-01-05 DIAGNOSIS — Z794 Long term (current) use of insulin: Secondary | ICD-10-CM | POA: Insufficient documentation

## 2015-01-05 DIAGNOSIS — E785 Hyperlipidemia, unspecified: Secondary | ICD-10-CM | POA: Diagnosis not present

## 2015-01-05 DIAGNOSIS — Z7901 Long term (current) use of anticoagulants: Secondary | ICD-10-CM | POA: Diagnosis not present

## 2015-01-05 DIAGNOSIS — Z9282 Status post administration of tPA (rtPA) in a different facility within the last 24 hours prior to admission to current facility: Secondary | ICD-10-CM | POA: Diagnosis not present

## 2015-01-05 DIAGNOSIS — I639 Cerebral infarction, unspecified: Secondary | ICD-10-CM | POA: Diagnosis not present

## 2015-01-05 DIAGNOSIS — Z950 Presence of cardiac pacemaker: Secondary | ICD-10-CM | POA: Insufficient documentation

## 2015-01-05 DIAGNOSIS — J449 Chronic obstructive pulmonary disease, unspecified: Secondary | ICD-10-CM | POA: Diagnosis not present

## 2015-01-05 DIAGNOSIS — Z87891 Personal history of nicotine dependence: Secondary | ICD-10-CM | POA: Insufficient documentation

## 2015-01-05 DIAGNOSIS — N189 Chronic kidney disease, unspecified: Secondary | ICD-10-CM | POA: Diagnosis not present

## 2015-01-05 DIAGNOSIS — I129 Hypertensive chronic kidney disease with stage 1 through stage 4 chronic kidney disease, or unspecified chronic kidney disease: Secondary | ICD-10-CM | POA: Diagnosis not present

## 2015-01-05 DIAGNOSIS — E119 Type 2 diabetes mellitus without complications: Secondary | ICD-10-CM | POA: Insufficient documentation

## 2015-01-05 LAB — URINALYSIS, ROUTINE W REFLEX MICROSCOPIC
Bilirubin Urine: NEGATIVE
KETONES UR: NEGATIVE mg/dL
Leukocytes, UA: NEGATIVE
NITRITE: NEGATIVE
Protein, ur: 100 mg/dL — AB
Specific Gravity, Urine: 1.02 (ref 1.005–1.030)
UROBILINOGEN UA: 0.2 mg/dL (ref 0.0–1.0)
pH: 7 (ref 5.0–8.0)

## 2015-01-05 LAB — COMPREHENSIVE METABOLIC PANEL
ALK PHOS: 37 U/L — AB (ref 38–126)
ALT: 15 U/L — AB (ref 17–63)
AST: 23 U/L (ref 15–41)
Albumin: 4.1 g/dL (ref 3.5–5.0)
Anion gap: 11 (ref 5–15)
BUN: 31 mg/dL — ABNORMAL HIGH (ref 6–20)
CO2: 21 mmol/L — ABNORMAL LOW (ref 22–32)
Calcium: 9 mg/dL (ref 8.9–10.3)
Chloride: 105 mmol/L (ref 101–111)
Creatinine, Ser: 1.57 mg/dL — ABNORMAL HIGH (ref 0.61–1.24)
GFR calc Af Amer: 48 mL/min — ABNORMAL LOW (ref >60)
GFR calc non Af Amer: 41 mL/min — ABNORMAL LOW (ref >60)
Glucose, Bld: 305 mg/dL — ABNORMAL HIGH (ref 65–99)
Potassium: 4.1 mmol/L (ref 3.5–5.1)
SODIUM: 137 mmol/L (ref 135–145)
TOTAL PROTEIN: 7.3 g/dL (ref 6.5–8.1)
Total Bilirubin: 2.1 mg/dL — ABNORMAL HIGH (ref 0.3–1.2)

## 2015-01-05 LAB — CBC
HCT: 42.7 % (ref 39.0–52.0)
Hemoglobin: 15.7 g/dL (ref 13.0–17.0)
MCH: 33.5 pg (ref 26.0–34.0)
MCHC: 36.8 g/dL — ABNORMAL HIGH (ref 30.0–36.0)
MCV: 91.2 fL (ref 78.0–100.0)
PLATELETS: 101 10*3/uL — AB (ref 150–400)
RBC: 4.68 MIL/uL (ref 4.22–5.81)
RDW: 14 % (ref 11.5–15.5)
WBC: 7.8 10*3/uL (ref 4.0–10.5)

## 2015-01-05 LAB — RAPID URINE DRUG SCREEN, HOSP PERFORMED
Amphetamines: NOT DETECTED
BARBITURATES: NOT DETECTED
Benzodiazepines: POSITIVE — AB
Cocaine: NOT DETECTED
OPIATES: NOT DETECTED
Tetrahydrocannabinol: NOT DETECTED

## 2015-01-05 LAB — DIFFERENTIAL
BASOS ABS: 0 10*3/uL (ref 0.0–0.1)
Basophils Relative: 0 %
Eosinophils Absolute: 0.3 10*3/uL (ref 0.0–0.7)
Eosinophils Relative: 4 %
Lymphocytes Relative: 19 %
Lymphs Abs: 1.5 10*3/uL (ref 0.7–4.0)
Monocytes Absolute: 0.6 10*3/uL (ref 0.1–1.0)
Monocytes Relative: 8 %
NEUTROS ABS: 5.3 10*3/uL (ref 1.7–7.7)
NEUTROS PCT: 68 %

## 2015-01-05 LAB — I-STAT CHEM 8, ED
BUN: 31 mg/dL — AB (ref 6–20)
CHLORIDE: 104 mmol/L (ref 101–111)
Calcium, Ion: 1.06 mmol/L — ABNORMAL LOW (ref 1.13–1.30)
Creatinine, Ser: 1.6 mg/dL — ABNORMAL HIGH (ref 0.61–1.24)
Glucose, Bld: 317 mg/dL — ABNORMAL HIGH (ref 65–99)
HCT: 47 % (ref 39.0–52.0)
Hemoglobin: 16 g/dL (ref 13.0–17.0)
Potassium: 4.1 mmol/L (ref 3.5–5.1)
SODIUM: 138 mmol/L (ref 135–145)
TCO2: 20 mmol/L (ref 0–100)

## 2015-01-05 LAB — APTT: aPTT: 32 seconds (ref 24–37)

## 2015-01-05 LAB — URINE MICROSCOPIC-ADD ON

## 2015-01-05 LAB — I-STAT TROPONIN, ED: Troponin i, poc: 0 ng/mL (ref 0.00–0.08)

## 2015-01-05 LAB — PROTIME-INR
INR: 1.62 — ABNORMAL HIGH (ref 0.00–1.49)
PROTHROMBIN TIME: 19.3 s — AB (ref 11.6–15.2)

## 2015-01-05 LAB — ETHANOL: Alcohol, Ethyl (B): <5 mg/dL (ref <5)

## 2015-01-05 MED ORDER — SODIUM CHLORIDE 0.9 % IV SOLN
50.0000 mL | Freq: Once | INTRAVENOUS | Status: AC
  Administered 2015-01-05: 50 mL via INTRAVENOUS

## 2015-01-05 MED ORDER — NALOXONE HCL 0.4 MG/ML IJ SOLN
0.4000 mg | Freq: Once | INTRAMUSCULAR | Status: AC
  Administered 2015-01-05: 0.4 mg via INTRAVENOUS
  Filled 2015-01-05: qty 1

## 2015-01-05 MED ORDER — ALTEPLASE 100 MG IV SOLR
INTRAVENOUS | Status: AC
  Filled 2015-01-05: qty 100

## 2015-01-05 MED ORDER — ALTEPLASE (STROKE) FULL DOSE INFUSION
0.9000 mg/kg | Freq: Once | INTRAVENOUS | Status: AC
  Administered 2015-01-05: 78 mg via INTRAVENOUS
  Filled 2015-01-05: qty 78

## 2015-01-05 MED ORDER — SODIUM CHLORIDE 0.9 % IV SOLN
INTRAVENOUS | Status: DC
  Administered 2015-01-05: 18:00:00 via INTRAVENOUS

## 2015-01-05 NOTE — ED Notes (Signed)
Patient changed from NRB to Venti mask at 30% FiO2

## 2015-01-05 NOTE — ED Notes (Signed)
Patient arrives via EMS from home. Witnessed syncope at home by wife. EMS reports periods of apnea, with respirations 10-12. Minimally responsive. MD, Thurnell Garbe at bedside.

## 2015-01-05 NOTE — ED Notes (Signed)
SOC assessment in progress. 2RNs present for assessment.

## 2015-01-05 NOTE — ED Provider Notes (Signed)
CSN: [id number]     Arrival date & time 01/05/15  1627 History   First MD Initiated Contact with Patient 01/05/15 1630     Chief Complaint  Patient presents with  . Code Stroke     The history is provided by the EMS personnel and the spouse. The history is limited by the condition of the patient.  Pt was seen at 1620.  Per EMS and pt's wife report: Call to EMS for witnessed syncope by pt's wife at 23. Pt's wife states pt walked into her room, sat on the end of the bed, stated his "mouth was drawing, I think I'm having a stroke." Pt's wife states pt's speech was slurred and he was crying. He then became unresponsive. EMS states pt was minimally responsive on their arrival to scene, having periods of apnea. EMS suctioned pt and prepared to intubate, but pt had +gag and became more responsive. CBG in 300's. EMS monitor paced rhythm, VSS. EMS called Code stroke en route.   Past Medical History  Diagnosis Date  . Hypertension   . COPD (chronic obstructive pulmonary disease) ([place])   . TIA (transient ischemic attack)   . Presence of cardiac defibrillator   . PVD (peripheral vascular disease) ([place])   . CHF (congestive heart failure) ([place])   . Hyperlipemia   . Coronary artery disease   . Chronic kidney disease   . Gout   . Sleep apnea   . History of blood transfusion   . AICD (automatic cardioverter/defibrillator) present   . Presence of permanent cardiac pacemaker   . Shortness of breath dyspnea     with exertion  . Diabetes mellitus     TYpe II  . GERD (gastroesophageal reflux disease)   . CVA (cerebral vascular accident) ([place])     6 . no residual  . DVT (deep venous thrombosis) ([place])    Past Surgical History  Procedure Laterality Date  . Bypass graft      (2) Triple CABG, 5 known stents in place. Pt has ICD with pacemaker in place.   . Cholecystectomy    . Abdominal aortic aneurysm repair    . Colonoscopy w/ polypectomy    . Coronary artery bypass graft  1985, 2003  . I&d  extremity Left 06/13/2014    Procedure: IRRIGATION AND DEBRIDEMENT Left Hand Wound;  Surgeon: Iran Planas, MD;  Location: [facility];  Service: Orthopedics;  Laterality: Left;   Family History  Problem Relation Age of Onset  . Leukemia Father   . CAD Mother   . CAD Maternal Grandfather   . CAD Maternal Grandmother   . CAD Brother    Social History  Substance Use Topics  . Smoking status: Former Research scientist (life sciences)  . Smokeless tobacco: Never Used     Comment: quit in 1985  . Alcohol Use: Yes     Comment: occ 1 glass of wiine    Review of Systems  Unable to perform ROS: Acuity of condition     Allergies  Aspirin and Ivp dye  Home Medications   Prior to Admission medications   Medication Sig Start Date End Date Taking? Authorizing Provider  allopurinol (ZYLOPRIM) 100 MG tablet Take 100 mg by mouth daily.    Historical Provider, MD  atorvastatin (LIPITOR) 80 MG tablet Take 80 mg by mouth daily at 6 PM.    Historical Provider, MD  calcium carbonate (TUMS - DOSED IN MG ELEMENTAL CALCIUM) 500 MG chewable tablet Chew 1 tablet by mouth 2 (  two) times daily.    Historical Provider, MD  carvedilol (COREG) 3.125 MG tablet Take 3.125 mg by mouth 2 (two) times daily with a meal.    Historical Provider, MD  cholecalciferol (VITAMIN D) 1000 UNITS tablet Take 1,000 Units by mouth daily.    Historical Provider, MD  docusate sodium (COLACE) 100 MG capsule Take 1 capsule (100 mg total) by mouth 2 (two) times daily. 06/13/14   Iran Planas, MD  furosemide (LASIX) 10 MG/ML injection Inject 4 mLs (40 mg total) into the vein every 12 (twelve) hours. 11/14/12   Modena Jansky, MD  furosemide (LASIX) 40 MG tablet Take 40 mg by mouth daily as needed for fluid (40mg  - 80 mg). Prn Leg Swelling    Historical Provider, MD  HYDROcodone-acetaminophen (NORCO/VICODIN) 5-325 MG per tablet Take 1 tablet by mouth every 4 (four) hours as needed for moderate pain.    Historical Provider, MD  insulin aspart (NOVOLOG) 100 UNIT/ML  injection Inject 0-15 Units into the skin 3 (three) times daily with meals.    Historical Provider, MD  insulin detemir (LEVEMIR) 100 UNIT/ML injection Inject 0.15 mLs (15 Units total) into the skin daily. 11/14/12   Modena Jansky, MD  isosorbide mononitrate (IMDUR) 30 MG 24 hr tablet Take 30 mg by mouth daily.    Historical Provider, MD  levETIRAcetam (KEPPRA) 500 MG tablet Take 500 mg by mouth 2 (two) times daily. Patient not taking    Historical Provider, MD  lisinopril (PRINIVIL,ZESTRIL) 2.5 MG tablet Take 2.5 mg by mouth daily.    Historical Provider, MD  nitroGLYCERIN (NITROSTAT) 0.4 MG SL tablet Place 0.4 mg under the tongue every 5 (five) minutes as needed for chest pain.    Historical Provider, MD  oxyCODONE-acetaminophen (PERCOCET) 10-325 MG per tablet Take 1 tablet by mouth every 4 (four) hours as needed for pain. 06/13/14   Iran Planas, MD  ramipril (ALTACE) 2.5 MG capsule Take 1 capsule (2.5 mg total) by mouth daily. 11/14/12   Modena Jansky, MD  ranitidine (ZANTAC) 75 MG tablet Take 75 mg by mouth 2 (two) times daily.    Historical Provider, MD  ranolazine (RANEXA) 500 MG 12 hr tablet Take 500 mg by mouth 2 (two) times daily.    Historical Provider, MD  simvastatin (ZOCOR) 40 MG tablet Take 40 mg by mouth every evening.    Historical Provider, MD  temazepam (RESTORIL) 15 MG capsule Take 15 mg by mouth at bedtime.    Historical Provider, MD  vitamin C (ASCORBIC ACID) 500 MG tablet Take 500 mg by mouth daily.    Historical Provider, MD  Vitamin D, Ergocalciferol, (DRISDOL) 50000 UNITS CAPS capsule Take 50,000 Units by mouth every 7 (seven) days.    Historical Provider, MD  warfarin (COUMADIN) 5 MG tablet Take 5 mg by mouth daily.    Historical Provider, MD  warfarin (COUMADIN) 5 MG tablet Take 5 mg by mouth daily.    Historical Provider, MD   BP 123/77 mmHg  Pulse 72  Temp(Src) 97.9 F (36.6 C) (Rectal)  Resp 14  SpO2 100% Physical Exam  1620: Physical examination:  Nursing  notes reviewed; Vital signs and O2 SAT reviewed;  Constitutional: Well developed, Well nourished, Well hydrated, In no acute distress; Head:  Normocephalic, atraumatic; Eyes: EOMI, PERRL, No scleral icterus; ENMT: Mouth and pharynx normal, Mucous membranes moist; Neck: C-collar in place. Trachea midline; Cardiovascular: Regular rate and rhythm, No gallop; Respiratory: Breath sounds clear & equal bilaterally, No wheezes.  Normal respiratory effort/excursion; Chest: Nontender, Movement normal; Abdomen: Soft, +easily reducible ventral hernia. Nondistended, Normal bowel sounds;; Extremities: Pulses normal, No deformity. No edema, No calf edema or asymmetry.; Neuro: Laying with eyes closed. Eyelids fluttering, will resist staff attempt to open eyelids. Non-verbal. +left facial droop. No spontaneous movements of extremities. If I lift pt's upper extremities, pt will hold them up for a few seconds then drop them again. Drops bilat LE's when lifted by staff.  Great toes downgoing.; Skin: Color normal, Warm, Dry.   ED Course  Procedures (including critical care time) Labs Review   Imaging Review  I have personally reviewed and evaluated these images and lab results as part of my medical decision-making.   EKG Interpretation   Date/Time:  Saturday January 05 2015 16:28:14 EDT Ventricular Rate:  74 PR Interval:    QRS Duration: 147 QT Interval:  440 QTC Calculation: 488 R Axis:   -103 Text Interpretation:  Ventricular-paced rhythm since last tracing no  significant change Confirmed by [facility]  MD, Nunzio Cory [phone]) on  01/05/2015 4:43:09 PM      MDM  MDM Reviewed: previous chart, nursing note and vitals Reviewed previous: labs and ECG Interpretation: labs, ECG, x-ray and CT scan Total time providing critical care: 30-74 minutes. This excludes time spent performing separately reportable procedures and services. Consults: neurology   CRITICAL CARE Performed by: Alfonzo Feller Total  critical care time: 45 Critical care time was exclusive of separately billable procedures and treating other patients. Critical care was necessary to treat or prevent imminent or life-threatening deterioration. Critical care was time spent personally by me on the following activities: development of treatment plan with patient and/or surrogate as well as nursing, discussions with consultants, evaluation of patient's response to treatment, examination of patient, obtaining history from patient or surrogate, ordering and performing treatments and interventions, ordering and review of laboratory studies, ordering and review of radiographic studies, pulse oximetry and re-evaluation of patient's condition.   Results for orders placed or performed during the hospital encounter of 01/05/15  Ethanol  Result Value Ref Range   Alcohol, Ethyl (B) <5 <5 mg/dL  Protime-INR  Result Value Ref Range   Prothrombin Time 19.3 (H) 11.6 - 15.2 seconds   INR 1.62 (H) 0.00 - 1.49  APTT  Result Value Ref Range   aPTT 32 24 - 37 seconds  CBC  Result Value Ref Range   WBC 7.8 4.0 - 10.5 K/uL   RBC 4.68 4.22 - 5.81 MIL/uL   Hemoglobin 15.7 13.0 - 17.0 g/dL   HCT 42.7 39.0 - 52.0 %   MCV 91.2 78.0 - 100.0 fL   MCH 33.5 26.0 - 34.0 pg   MCHC 36.8 (H) 30.0 - 36.0 g/dL   RDW 14.0 11.5 - 15.5 %   Platelets 101 (L) 150 - 400 K/uL  Differential  Result Value Ref Range   Neutrophils Relative % 68 %   Neutro Abs 5.3 1.7 - 7.7 K/uL   Lymphocytes Relative 19 %   Lymphs Abs 1.5 0.7 - 4.0 K/uL   Monocytes Relative 8 %   Monocytes Absolute 0.6 0.1 - 1.0 K/uL   Eosinophils Relative 4 %   Eosinophils Absolute 0.3 0.0 - 0.7 K/uL   Basophils Relative 0 %   Basophils Absolute 0.0 0.0 - 0.1 K/uL  Comprehensive metabolic panel  Result Value Ref Range   Sodium 137 135 - 145 mmol/L   Potassium 4.1 3.5 - 5.1 mmol/L   Chloride 105 101 - 111  mmol/L   CO2 21 (L) 22 - 32 mmol/L   Glucose, Bld 305 (H) 65 - 99 mg/dL   BUN 31  (H) 6 - 20 mg/dL   Creatinine, Ser 1.57 (H) 0.61 - 1.24 mg/dL   Calcium 9.0 8.9 - 10.3 mg/dL   Total Protein 7.3 6.5 - 8.1 g/dL   Albumin 4.1 3.5 - 5.0 g/dL   AST 23 15 - 41 U/L   ALT 15 (L) 17 - 63 U/L   Alkaline Phosphatase 37 (L) 38 - 126 U/L   Total Bilirubin 2.1 (H) 0.3 - 1.2 mg/dL   GFR calc non Af Amer 41 (L) >60 mL/min   GFR calc Af Amer 48 (L) >60 mL/min   Anion gap 11 5 - 15  Urinalysis, Routine w reflex microscopic (not at [facility])  Result Value Ref Range   Color, Urine YELLOW YELLOW   APPearance CLEAR CLEAR   Specific Gravity, Urine 1.020 1.005 - 1.030   pH 7.0 5.0 - 8.0   Glucose, UA >1000 (A) NEGATIVE mg/dL   Hgb urine dipstick TRACE (A) NEGATIVE   Bilirubin Urine NEGATIVE NEGATIVE   Ketones, ur NEGATIVE NEGATIVE mg/dL   Protein, ur 100 (A) NEGATIVE mg/dL   Urobilinogen, UA 0.2 0.0 - 1.0 mg/dL   Nitrite NEGATIVE NEGATIVE   Leukocytes, UA NEGATIVE NEGATIVE  Urine microscopic-add on  Result Value Ref Range   Squamous Epithelial / LPF RARE RARE   WBC, UA 0-2 <3 WBC/hpf   RBC / HPF 0-2 <3 RBC/hpf   Bacteria, UA RARE RARE  I-Stat Chem 8, ED  (not at [facility], [facility])  Result Value Ref Range   Sodium 138 135 - 145 mmol/L   Potassium 4.1 3.5 - 5.1 mmol/L   Chloride 104 101 - 111 mmol/L   BUN 31 (H) 6 - 20 mg/dL   Creatinine, Ser 1.60 (H) 0.61 - 1.24 mg/dL   Glucose, Bld 317 (H) 65 - 99 mg/dL   Calcium, Ion 1.06 (L) 1.13 - 1.30 mmol/L   TCO2 20 0 - 100 mmol/L   Hemoglobin 16.0 13.0 - 17.0 g/dL   HCT 47.0 39.0 - 52.0 %  I-stat troponin, ED (not at [facility], [facility])  Result Value Ref Range   Troponin i, poc 0.00 0.00 - 0.08 ng/mL   Comment 3           Ct Head Wo Contrast 01/05/2015  ADDENDUM REPORT: 01/05/2015 16:58 ADDENDUM: These results were called by telephone at the time of interpretation on 01/05/2015 at 4:58 pm to Dr. Francine Graven , who verbally acknowledged these results. Electronically Signed   By: Lajean Manes M.D.   On: 01/05/2015 16:58  01/05/2015  CLINICAL  DATA:  Patient collapsed and front of his family. History of hypertension and diabetes. EXAM: CT HEAD WITHOUT CONTRAST TECHNIQUE: Contiguous axial images were obtained from the base of the skull through the vertex without intravenous contrast. COMPARISON:  10/24/2014 FINDINGS: The ventricles are normal in configuration and are normal in size for this patient's age. There are no parenchymal masses or mass effect. There is no evidence of a recent cortical infarct. There is a small lacune infarct along the medial aspect of the left caudate nucleus head. There is minor periventricular white matter hypoattenuation consistent with chronic microvascular ischemic change. There are no extra-axial masses or abnormal fluid collections. There is no intracranial hemorrhage. There is mild inferior frontal and ethmoid sinus mucosal thickening. Mastoid air cells are clear. IMPRESSION: 1. No acute intracranial abnormalities. 2.  Age related volume loss. Minor chronic microvascular ischemic change. 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 Electronically Signed: By: Lajean Manes M.D. On: 01/05/2015 16:52   Dg Chest Portable 1 View 01/05/2015  CLINICAL DATA:  CODE STROKE, Patient arrives via EMS from home. Witnessed syncope at home by wife. EMS reports periods of apnea, with respirations 10-12. Minimally responsive, HISTORY OF CHF, DIABETES, HTN, CAD, COPD, PVD, CKD. PATIENT [place]. EXAM: PORTABLE CHEST 1 VIEW COMPARISON:  Chest CT 10/24/2014.  Plain film of 10/24/2014. FINDINGS: Pacer/AICD device. No lead discontinuity. Midline trachea. Mild cardiomegaly with transverse aortic atherosclerosis. Numerous leads and wires project over the chest. No right and no definite left pleural effusion. No pneumothorax. No congestive failure. Probable atelectasis at the right lung base with mild left base scarring. IMPRESSION: No acute cardiopulmonary disease. Suboptimal evaluation of the inferior left hemi thorax, secondary AP portable  technique and overlying wires and leads. Cardiomegaly and atherosclerosis, without congestive failure. Electronically Signed   By: Abigail Miyamoto M.D.   On: 01/05/2015 17:16     1710. Pt has become gradually more responsive since arrival to the ED, and is now awake/alert. Pt will now nod yes/no to questions, is aphasic, and will spontaneously minimally move LUE > RUE, both LE's drop when lifted. [facility] MD evaluation pending.   1715:  [facility] MD assessing pt: Pt is now awake/alert, nods head yes/no to questions, aphasic, can lift his LUE off stretcher, actively move left foot/toes. Pt has minimal movement on stretcher RUE and RLE.  [place] Neuro MD recommends TPA.   55:  SOC has spoken with pt and his wife: they are in agreement (pt apparently received TPA 5 years ago and understood this process well already). IV TPA ordered.   1805:  T/C to [facility] Neurohospitalist Dr. Janann Colonel, case discussed, including:  HPI, pertinent PM/SHx, VS/PE, dx testing, ED course and treatment:  States no IR, will not accept transfer.   1825:  T/C to [facility] Neuro Dr. Lyndel Safe, case discussed, including:  HPI, pertinent PM/SHx, VS/PE, dx testing, ED course and treatment:  Agreeable to accept transfer/admit, requests to send to ED. [facility] EDP called and made aware.     Francine Graven, DO 01/09/15 2055

## 2015-01-05 NOTE — ED Notes (Signed)
EMS at bedside to transport

## 2015-01-05 NOTE — Progress Notes (Signed)
CODE STROKE  - phone call fron ED @ 16:30. Scan began at [place] called @ 16:44 spoke with Erline Levine.

## 2015-01-05 NOTE — ED Notes (Signed)
More information from wife: Patient sat on the end of bed in her room, stating that his "mouth was drawing, I think I'm having a stroke". Wife reports slurred speech, pt crying, and then stopped answering questions. States patient does not drink ETOH, takes medications as prescribed, and is a Full Code.

## 2015-01-05 NOTE — ED Notes (Signed)
Patient transported to CT 

## 2016-12-10 DIAGNOSIS — Y9389 Activity, other specified: Secondary | ICD-10-CM | POA: Diagnosis not present

## 2016-12-10 DIAGNOSIS — Z951 Presence of aortocoronary bypass graft: Secondary | ICD-10-CM | POA: Insufficient documentation

## 2016-12-10 DIAGNOSIS — Z79899 Other long term (current) drug therapy: Secondary | ICD-10-CM | POA: Insufficient documentation

## 2016-12-10 DIAGNOSIS — N189 Chronic kidney disease, unspecified: Secondary | ICD-10-CM | POA: Diagnosis not present

## 2016-12-10 DIAGNOSIS — Z8673 Personal history of transient ischemic attack (TIA), and cerebral infarction without residual deficits: Secondary | ICD-10-CM | POA: Diagnosis not present

## 2016-12-10 DIAGNOSIS — I509 Heart failure, unspecified: Secondary | ICD-10-CM | POA: Diagnosis not present

## 2016-12-10 DIAGNOSIS — W228XXA Striking against or struck by other objects, initial encounter: Secondary | ICD-10-CM | POA: Insufficient documentation

## 2016-12-10 DIAGNOSIS — I2510 Atherosclerotic heart disease of native coronary artery without angina pectoris: Secondary | ICD-10-CM | POA: Insufficient documentation

## 2016-12-10 DIAGNOSIS — Y998 Other external cause status: Secondary | ICD-10-CM | POA: Diagnosis not present

## 2016-12-10 DIAGNOSIS — Z794 Long term (current) use of insulin: Secondary | ICD-10-CM | POA: Diagnosis not present

## 2016-12-10 DIAGNOSIS — J449 Chronic obstructive pulmonary disease, unspecified: Secondary | ICD-10-CM | POA: Insufficient documentation

## 2016-12-10 DIAGNOSIS — S51011A Laceration without foreign body of right elbow, initial encounter: Secondary | ICD-10-CM | POA: Insufficient documentation

## 2016-12-10 DIAGNOSIS — Z7901 Long term (current) use of anticoagulants: Secondary | ICD-10-CM | POA: Diagnosis not present

## 2016-12-10 DIAGNOSIS — E1122 Type 2 diabetes mellitus with diabetic chronic kidney disease: Secondary | ICD-10-CM | POA: Diagnosis not present

## 2016-12-10 DIAGNOSIS — I130 Hypertensive heart and chronic kidney disease with heart failure and stage 1 through stage 4 chronic kidney disease, or unspecified chronic kidney disease: Secondary | ICD-10-CM | POA: Diagnosis not present

## 2016-12-10 DIAGNOSIS — Z9049 Acquired absence of other specified parts of digestive tract: Secondary | ICD-10-CM | POA: Insufficient documentation

## 2016-12-10 DIAGNOSIS — Z87891 Personal history of nicotine dependence: Secondary | ICD-10-CM | POA: Diagnosis not present

## 2016-12-10 DIAGNOSIS — Z95810 Presence of automatic (implantable) cardiac defibrillator: Secondary | ICD-10-CM | POA: Diagnosis not present

## 2016-12-10 DIAGNOSIS — Y929 Unspecified place or not applicable: Secondary | ICD-10-CM | POA: Insufficient documentation

## 2016-12-11 ENCOUNTER — Emergency Department (HOSPITAL_COMMUNITY): Payer: Medicare Other

## 2016-12-11 ENCOUNTER — Encounter (HOSPITAL_COMMUNITY): Payer: Self-pay | Admitting: Emergency Medicine

## 2016-12-11 ENCOUNTER — Emergency Department (HOSPITAL_COMMUNITY)
Admission: EM | Admit: 2016-12-11 | Discharge: 2016-12-11 | Disposition: A | Discharge status: Home / Self Care | Payer: Medicare Other | Attending: Emergency Medicine | Admitting: Emergency Medicine

## 2016-12-11 DIAGNOSIS — S60511A Abrasion of right hand, initial encounter: Secondary | ICD-10-CM

## 2016-12-11 DIAGNOSIS — S51011A Laceration without foreign body of right elbow, initial encounter: Secondary | ICD-10-CM | POA: Diagnosis not present

## 2016-12-11 NOTE — Discharge Instructions (Signed)
Keep the wound clean and dry. Recheck for any signs of infection. The steri-strips will fall off in about 7-10 days. Use ice packs for comfort.
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
## 2017-04-15 ENCOUNTER — Inpatient Hospital Stay (HOSPITAL_COMMUNITY): Payer: Medicare Other

## 2017-04-15 ENCOUNTER — Inpatient Hospital Stay (HOSPITAL_COMMUNITY)
Admission: AD | Admit: 2017-04-15 | Discharge: 2017-04-21 | DRG: 871 | Disposition: A | Discharge status: Skilled Nursing Facility | Payer: Medicare Other | Source: Other Acute Inpatient Hospital | Attending: Internal Medicine | Admitting: Internal Medicine

## 2017-04-15 DIAGNOSIS — E785 Hyperlipidemia, unspecified: Secondary | ICD-10-CM | POA: Diagnosis present

## 2017-04-15 DIAGNOSIS — Z95810 Presence of automatic (implantable) cardiac defibrillator: Secondary | ICD-10-CM

## 2017-04-15 DIAGNOSIS — I2510 Atherosclerotic heart disease of native coronary artery without angina pectoris: Secondary | ICD-10-CM

## 2017-04-15 DIAGNOSIS — J440 Chronic obstructive pulmonary disease with acute lower respiratory infection: Secondary | ICD-10-CM | POA: Diagnosis present

## 2017-04-15 DIAGNOSIS — Z9981 Dependence on supplemental oxygen: Secondary | ICD-10-CM

## 2017-04-15 DIAGNOSIS — Z8673 Personal history of transient ischemic attack (TIA), and cerebral infarction without residual deficits: Secondary | ICD-10-CM

## 2017-04-15 DIAGNOSIS — N183 Chronic kidney disease, stage 3 (moderate): Secondary | ICD-10-CM | POA: Diagnosis not present

## 2017-04-15 DIAGNOSIS — E876 Hypokalemia: Secondary | ICD-10-CM | POA: Diagnosis present

## 2017-04-15 DIAGNOSIS — R748 Abnormal levels of other serum enzymes: Secondary | ICD-10-CM | POA: Diagnosis not present

## 2017-04-15 DIAGNOSIS — J189 Pneumonia, unspecified organism: Secondary | ICD-10-CM | POA: Diagnosis not present

## 2017-04-15 DIAGNOSIS — R17 Unspecified jaundice: Secondary | ICD-10-CM | POA: Diagnosis present

## 2017-04-15 DIAGNOSIS — Z91041 Radiographic dye allergy status: Secondary | ICD-10-CM

## 2017-04-15 DIAGNOSIS — I5023 Acute on chronic systolic (congestive) heart failure: Secondary | ICD-10-CM | POA: Diagnosis present

## 2017-04-15 DIAGNOSIS — J9601 Acute respiratory failure with hypoxia: Secondary | ICD-10-CM | POA: Diagnosis not present

## 2017-04-15 DIAGNOSIS — J9621 Acute and chronic respiratory failure with hypoxia: Secondary | ICD-10-CM | POA: Diagnosis present

## 2017-04-15 DIAGNOSIS — R569 Unspecified convulsions: Secondary | ICD-10-CM | POA: Diagnosis present

## 2017-04-15 DIAGNOSIS — M109 Gout, unspecified: Secondary | ICD-10-CM | POA: Diagnosis present

## 2017-04-15 DIAGNOSIS — G4733 Obstructive sleep apnea (adult) (pediatric): Secondary | ICD-10-CM | POA: Diagnosis present

## 2017-04-15 DIAGNOSIS — J9611 Chronic respiratory failure with hypoxia: Secondary | ICD-10-CM | POA: Diagnosis present

## 2017-04-15 DIAGNOSIS — R6521 Severe sepsis with septic shock: Secondary | ICD-10-CM | POA: Diagnosis present

## 2017-04-15 DIAGNOSIS — R0600 Dyspnea, unspecified: Secondary | ICD-10-CM

## 2017-04-15 DIAGNOSIS — Z86711 Personal history of pulmonary embolism: Secondary | ICD-10-CM

## 2017-04-15 DIAGNOSIS — J181 Lobar pneumonia, unspecified organism: Secondary | ICD-10-CM | POA: Diagnosis not present

## 2017-04-15 DIAGNOSIS — Z886 Allergy status to analgesic agent status: Secondary | ICD-10-CM

## 2017-04-15 DIAGNOSIS — E1122 Type 2 diabetes mellitus with diabetic chronic kidney disease: Secondary | ICD-10-CM | POA: Diagnosis present

## 2017-04-15 DIAGNOSIS — I10 Essential (primary) hypertension: Secondary | ICD-10-CM | POA: Diagnosis present

## 2017-04-15 DIAGNOSIS — Z86718 Personal history of other venous thrombosis and embolism: Secondary | ICD-10-CM

## 2017-04-15 DIAGNOSIS — I255 Ischemic cardiomyopathy: Secondary | ICD-10-CM | POA: Diagnosis present

## 2017-04-15 DIAGNOSIS — Z79899 Other long term (current) drug therapy: Secondary | ICD-10-CM

## 2017-04-15 DIAGNOSIS — I130 Hypertensive heart and chronic kidney disease with heart failure and stage 1 through stage 4 chronic kidney disease, or unspecified chronic kidney disease: Secondary | ICD-10-CM | POA: Diagnosis present

## 2017-04-15 DIAGNOSIS — Z7901 Long term (current) use of anticoagulants: Secondary | ICD-10-CM

## 2017-04-15 DIAGNOSIS — K219 Gastro-esophageal reflux disease without esophagitis: Secondary | ICD-10-CM | POA: Diagnosis present

## 2017-04-15 DIAGNOSIS — I8290 Acute embolism and thrombosis of unspecified vein: Secondary | ICD-10-CM

## 2017-04-15 DIAGNOSIS — N179 Acute kidney failure, unspecified: Secondary | ICD-10-CM | POA: Diagnosis present

## 2017-04-15 DIAGNOSIS — E1165 Type 2 diabetes mellitus with hyperglycemia: Secondary | ICD-10-CM | POA: Diagnosis present

## 2017-04-15 DIAGNOSIS — G9340 Encephalopathy, unspecified: Secondary | ICD-10-CM | POA: Diagnosis present

## 2017-04-15 DIAGNOSIS — I2721 Secondary pulmonary arterial hypertension: Secondary | ICD-10-CM | POA: Diagnosis present

## 2017-04-15 DIAGNOSIS — Z9989 Dependence on other enabling machines and devices: Secondary | ICD-10-CM

## 2017-04-15 DIAGNOSIS — R0989 Other specified symptoms and signs involving the circulatory and respiratory systems: Secondary | ICD-10-CM

## 2017-04-15 DIAGNOSIS — A419 Sepsis, unspecified organism: Principal | ICD-10-CM

## 2017-04-15 DIAGNOSIS — Z87891 Personal history of nicotine dependence: Secondary | ICD-10-CM

## 2017-04-15 DIAGNOSIS — Z951 Presence of aortocoronary bypass graft: Secondary | ICD-10-CM

## 2017-04-15 DIAGNOSIS — E1151 Type 2 diabetes mellitus with diabetic peripheral angiopathy without gangrene: Secondary | ICD-10-CM | POA: Diagnosis present

## 2017-04-15 LAB — URINALYSIS, ROUTINE W REFLEX MICROSCOPIC
BACTERIA UA: NONE SEEN
Bilirubin Urine: NEGATIVE
Glucose, UA: 50 mg/dL — AB
Hgb urine dipstick: NEGATIVE
KETONES UR: NEGATIVE mg/dL
LEUKOCYTES UA: NEGATIVE
Nitrite: NEGATIVE
PH: 7 (ref 5.0–8.0)
Protein, ur: 30 mg/dL — AB
Specific Gravity, Urine: 1.013 (ref 1.005–1.030)

## 2017-04-15 LAB — PROTIME-INR
INR: 1.15
PROTHROMBIN TIME: 14.6 s (ref 11.4–15.2)

## 2017-04-15 LAB — GLUCOSE, CAPILLARY
GLUCOSE-CAPILLARY: 109 mg/dL — AB (ref 65–99)
GLUCOSE-CAPILLARY: 223 mg/dL — AB (ref 65–99)
GLUCOSE-CAPILLARY: 308 mg/dL — AB (ref 65–99)
GLUCOSE-CAPILLARY: 328 mg/dL — AB (ref 65–99)
Glucose-Capillary: 156 mg/dL — ABNORMAL HIGH (ref 65–99)
Glucose-Capillary: 281 mg/dL — ABNORMAL HIGH (ref 65–99)

## 2017-04-15 LAB — CBC WITH DIFFERENTIAL/PLATELET
BASOS ABS: 0 10*3/uL (ref 0.0–0.1)
BASOS PCT: 0 %
EOS ABS: 0.1 10*3/uL (ref 0.0–0.7)
Eosinophils Relative: 1 %
HCT: 45.4 % (ref 39.0–52.0)
HEMOGLOBIN: 15.5 g/dL (ref 13.0–17.0)
Lymphocytes Relative: 9 %
Lymphs Abs: 1.4 10*3/uL (ref 0.7–4.0)
MCH: 32.3 pg (ref 26.0–34.0)
MCHC: 34.1 g/dL (ref 30.0–36.0)
MCV: 94.6 fL (ref 78.0–100.0)
MONO ABS: 1.8 10*3/uL — AB (ref 0.1–1.0)
MONOS PCT: 12 %
NEUTROS PCT: 78 %
Neutro Abs: 11.8 10*3/uL — ABNORMAL HIGH (ref 1.7–7.7)
Platelets: 125 10*3/uL — ABNORMAL LOW (ref 150–400)
RBC: 4.8 MIL/uL (ref 4.22–5.81)
RDW: 14.2 % (ref 11.5–15.5)
WBC: 15.1 10*3/uL — ABNORMAL HIGH (ref 4.0–10.5)

## 2017-04-15 LAB — TROPONIN I: TROPONIN I: 0.14 ng/mL — AB (ref <0.03)

## 2017-04-15 LAB — STREP PNEUMONIAE URINARY ANTIGEN: STREP PNEUMO URINARY ANTIGEN: NEGATIVE

## 2017-04-15 LAB — RESPIRATORY PANEL BY PCR

## 2017-04-15 LAB — PHOSPHORUS: Phosphorus: 3.5 mg/dL (ref 2.5–4.6)

## 2017-04-15 LAB — COMPREHENSIVE METABOLIC PANEL
ALBUMIN: 3.1 g/dL — AB (ref 3.5–5.0)
ALT: 22 U/L (ref 17–63)
ANION GAP: 15 (ref 5–15)
AST: 37 U/L (ref 15–41)
Alkaline Phosphatase: 129 U/L — ABNORMAL HIGH (ref 38–126)
BILIRUBIN TOTAL: 4.5 mg/dL — AB (ref 0.3–1.2)
BUN: 63 mg/dL — ABNORMAL HIGH (ref 6–20)
CHLORIDE: 97 mmol/L — AB (ref 101–111)
CO2: 24 mmol/L (ref 22–32)
Calcium: 8.6 mg/dL — ABNORMAL LOW (ref 8.9–10.3)
Creatinine, Ser: 2.68 mg/dL — ABNORMAL HIGH (ref 0.61–1.24)
GFR calc Af Amer: 25 mL/min — ABNORMAL LOW (ref >60)
GFR calc non Af Amer: 21 mL/min — ABNORMAL LOW (ref >60)
Glucose, Bld: 161 mg/dL — ABNORMAL HIGH (ref 65–99)
POTASSIUM: 4.3 mmol/L (ref 3.5–5.1)
SODIUM: 136 mmol/L (ref 135–145)
TOTAL PROTEIN: 7.5 g/dL (ref 6.5–8.1)

## 2017-04-15 LAB — MRSA PCR SCREENING: MRSA BY PCR: NEGATIVE

## 2017-04-15 LAB — EXPECTORATED SPUTUM ASSESSMENT W GRAM STAIN, RFLX TO RESP C

## 2017-04-15 LAB — BRAIN NATRIURETIC PEPTIDE: B NATRIURETIC PEPTIDE 5: 903.6 pg/mL — AB (ref 0.0–100.0)

## 2017-04-15 LAB — PROCALCITONIN: PROCALCITONIN: 0.71 ng/mL

## 2017-04-15 LAB — CORTISOL: CORTISOL PLASMA: 19.6 ug/dL

## 2017-04-15 LAB — MAGNESIUM: Magnesium: 2.2 mg/dL (ref 1.7–2.4)

## 2017-04-15 LAB — LACTIC ACID, PLASMA: LACTIC ACID, VENOUS: 1.3 mmol/L (ref 0.5–1.9)

## 2017-04-15 MED ORDER — SENNOSIDES-DOCUSATE SODIUM 8.6-50 MG PO TABS
1.0000 | ORAL_TABLET | Freq: Two times a day (BID) | ORAL | Status: DC
  Administered 2017-04-15 – 2017-04-21 (×13): 1 via ORAL
  Filled 2017-04-15 (×13): qty 1

## 2017-04-15 MED ORDER — INSULIN GLARGINE 100 UNIT/ML ~~LOC~~ SOLN
10.0000 [IU] | Freq: Every day | SUBCUTANEOUS | Status: DC
  Administered 2017-04-15 – 2017-04-17 (×3): 10 [IU] via SUBCUTANEOUS
  Filled 2017-04-15 (×4): qty 0.1

## 2017-04-15 MED ORDER — INSULIN ASPART 100 UNIT/ML ~~LOC~~ SOLN
1.0000 [IU] | SUBCUTANEOUS | Status: DC
  Administered 2017-04-15 (×2): 3 [IU] via SUBCUTANEOUS
  Administered 2017-04-15: 2 [IU] via SUBCUTANEOUS

## 2017-04-15 MED ORDER — INSULIN ASPART 100 UNIT/ML ~~LOC~~ SOLN
0.0000 [IU] | Freq: Every day | SUBCUTANEOUS | Status: DC
  Administered 2017-04-15: 4 [IU] via SUBCUTANEOUS
  Administered 2017-04-17 – 2017-04-19 (×2): 2 [IU] via SUBCUTANEOUS

## 2017-04-15 MED ORDER — HEPARIN SODIUM (PORCINE) 5000 UNIT/ML IJ SOLN: 5000.0000 [IU] | Freq: Three times a day (TID) | INTRAMUSCULAR | Status: DC

## 2017-04-15 MED ORDER — INSULIN ASPART 100 UNIT/ML ~~LOC~~ SOLN
0.0000 [IU] | Freq: Three times a day (TID) | SUBCUTANEOUS | Status: DC
  Administered 2017-04-16: 5 [IU] via SUBCUTANEOUS
  Administered 2017-04-16: 2 [IU] via SUBCUTANEOUS
  Administered 2017-04-16: 5 [IU] via SUBCUTANEOUS
  Administered 2017-04-17: 1 [IU] via SUBCUTANEOUS
  Administered 2017-04-17: 5 [IU] via SUBCUTANEOUS
  Administered 2017-04-17: 8 [IU] via SUBCUTANEOUS
  Administered 2017-04-18 (×2): 5 [IU] via SUBCUTANEOUS
  Administered 2017-04-18: 3 [IU] via SUBCUTANEOUS
  Administered 2017-04-19: 5 [IU] via SUBCUTANEOUS
  Administered 2017-04-19: 3 [IU] via SUBCUTANEOUS
  Administered 2017-04-19: 5 [IU] via SUBCUTANEOUS
  Administered 2017-04-20: 3 [IU] via SUBCUTANEOUS

## 2017-04-15 MED ORDER — NOREPINEPHRINE BITARTRATE IV
4.00 | INTRAVENOUS | Status: DC
Start: ? — End: 2017-04-15

## 2017-04-15 MED ORDER — DEXTROSE 5 % IV SOLN: 1.0000 g | INTRAVENOUS | Status: DC

## 2017-04-15 MED ORDER — DEXTROSE 5 % IV SOLN
500.0000 mg | INTRAVENOUS | Status: DC
  Administered 2017-04-15: 500 mg via INTRAVENOUS
  Filled 2017-04-15 (×2): qty 500

## 2017-04-15 MED ORDER — DEXTROSE 5 % IV SOLN
1.0000 g | INTRAVENOUS | Status: DC
  Administered 2017-04-15 – 2017-04-18 (×4): 1 g via INTRAVENOUS
  Filled 2017-04-15 (×5): qty 10

## 2017-04-15 MED ORDER — WARFARIN - PHARMACIST DOSING INPATIENT
Freq: Every day | Status: DC
  Administered 2017-04-15: 19:00:00
  Administered 2017-04-17: 1
  Administered 2017-04-18: 17:00:00

## 2017-04-15 MED ORDER — WARFARIN SODIUM 3 MG PO TABS
6.0000 mg | ORAL_TABLET | Freq: Once | ORAL | Status: AC
  Administered 2017-04-15: 6 mg via ORAL
  Filled 2017-04-15: qty 2

## 2017-04-15 MED ORDER — WARFARIN SODIUM 7.5 MG PO TABS: 7.5000 mg | ORAL_TABLET | Freq: Once | ORAL | Status: DC

## 2017-04-15 MED ORDER — DOPAMINE IN D5W 1.6-5 MG/ML-% IV SOLN
0.00 | INTRAVENOUS | Status: DC
Start: ? — End: 2017-04-15

## 2017-04-15 MED ORDER — NOREPINEPHRINE BITARTRATE 1 MG/ML IV SOLN
0.0000 ug/min | INTRAVENOUS | Status: DC
  Administered 2017-04-15: 8 ug/min via INTRAVENOUS
  Filled 2017-04-15: qty 4

## 2017-04-15 MED ORDER — SODIUM CHLORIDE 0.9 % IV SOLN: 250.0000 mL | INTRAVENOUS | Status: DC | PRN

## 2017-04-15 MED ORDER — ALBUMIN HUMAN 25 % IV SOLN
25.0000 g | INTRAVENOUS | Status: DC
  Administered 2017-04-15 (×2): 25 g via INTRAVENOUS
  Filled 2017-04-15: qty 150
  Filled 2017-04-15: qty 50

## 2017-04-15 MED ORDER — ENOXAPARIN SODIUM 80 MG/0.8ML ~~LOC~~ SOLN
1.0000 mg/kg | SUBCUTANEOUS | Status: DC
  Administered 2017-04-15 – 2017-04-18 (×4): 80 mg via SUBCUTANEOUS
  Filled 2017-04-15 (×4): qty 0.8

## 2017-04-15 MED ORDER — ORAL CARE MOUTH RINSE
15.0000 mL | Freq: Two times a day (BID) | OROMUCOSAL | Status: DC
  Administered 2017-04-15 – 2017-04-21 (×13): 15 mL via OROMUCOSAL

## 2017-04-15 MED ORDER — LEVETIRACETAM 500 MG PO TABS
500.0000 mg | ORAL_TABLET | Freq: Two times a day (BID) | ORAL | Status: DC
  Administered 2017-04-15 – 2017-04-21 (×13): 500 mg via ORAL
  Filled 2017-04-15 (×4): qty 1
  Filled 2017-04-15: qty 2
  Filled 2017-04-15 (×4): qty 1
  Filled 2017-04-15: qty 2
  Filled 2017-04-15 (×3): qty 1

## 2017-04-15 MED ORDER — VANCOMYCIN HCL IN DEXTROSE 750-5 MG/150ML-% IV SOLN: 750.0000 mg | INTRAVENOUS | Status: DC

## 2017-04-15 NOTE — Progress Notes (Addendum)
ANTICOAGULATION CONSULT NOTE - Initial Consult  Pharmacy Consult for warfarin + enoxaparin Indication: hx DVT/PEs  Allergies  Allergen Reactions  . Aspirin Anaphylaxis  . Ivp Dye [Iodinated Diagnostic Agents] Anaphylaxis    Blackout    Patient Measurements: Height: 5\' 7"  (170.2 cm) Weight: 170 lb 13.7 oz (77.5 kg) IBW/kg (Calculated) : 66.1 Heparin Dosing Weight: 77.5 kg  Vital Signs: Temp: 98.6 F (37 C) (01/24 0430) Temp Source: Oral (01/24 0430) BP: 88/53 (01/24 0700) Pulse Rate: 70 (01/24 0700)  Labs: Recent Labs    04/15/17 0408  HGB 15.5  HCT 45.4  PLT 125*  LABPROT 14.6  INR 1.15  CREATININE 2.68*    Estimated Creatinine Clearance: 21.6 mL/min (A) (by C-G formula based on SCr of 2.68 mg/dL (H)).   Medical History: Past Medical History:  Diagnosis Date  . AICD (automatic cardioverter/defibrillator) present   . CHF (congestive heart failure) ([place])   . Chronic kidney disease   . COPD (chronic obstructive pulmonary disease) ([place])   . Coronary artery disease   . CVA (cerebral vascular accident) ([place])    6 . no residual  . Diabetes mellitus    TYpe II  . DVT (deep venous thrombosis) ([place])   . GERD (gastroesophageal reflux disease)   . Gout   . History of blood transfusion   . Hyperlipemia   . Hypertension   . Presence of cardiac defibrillator   . Presence of permanent cardiac pacemaker   . PVD (peripheral vascular disease) ([place])   . Shortness of breath dyspnea    with exertion  . Sleep apnea   . TIA (transient ischemic attack)     Medications:  Scheduled:  . insulin aspart  1-3 Units Subcutaneous Q4H  . levETIRAcetam  500 mg Oral BID  . mouth rinse  15 mL Mouth Rinse BID    Assessment: 79 yom transferred from OSH with concerns for sepsis. Was presenting with confusion, weakness, and rigors; hypotension and fever noted at OSH. On warfarin PTA for history of multiple DVTs and PEs. Patient unsure of dose last time took; however, last dose was  on 1/22.  Home regimen is 5 mg and 2.5 mg alternating, confirmed with patient.  INR today on admission is subtherapeutic at 1.15. Hgb is 15.5 with platelets slightly low at 125. No signs/symptoms of bleeding. Antibiotics changed to ceftriaxone/azithromycin for CAP coverage. Given history of VTEs and CVA, team wants to do bridge while subtherapeutic - dosing enoxaparin at 1 mg/kg daily given elevated Scr 2.68 (CrCl ~22 mL/min).   Goal of Therapy:  INR 2-3 Monitor platelets by anticoagulation protocol: Yes   Plan:  Order warfarin 6 mg once tonight Enoxaparin 80 mg once daily until INR 1.8 - monitor renal function closely Monitor daily INR and CBC Monitor signs/symptoms of bleeding  Doylene Canard, PharmD Clinical Pharmacist  Pager: [phone] Clinical Phone for 04/15/2017 until 3:30pm: [phone] If after 3:30pm, please call main pharmacy at [phone] 04/15/2017,7:58 AM

## 2017-04-15 NOTE — Progress Notes (Signed)
Responded to [facility] to support patient and assist with AD patient did not request form.  Form was left for his review in the event he want to consider completing later.  Chaplain will follow as needed. Jaclynn Major, [place], [facility], Pager [phone]

## 2017-04-15 NOTE — Progress Notes (Signed)
Ok to d/c droplet precautions d/t negative viral panel per infection prevention

## 2017-04-15 NOTE — Progress Notes (Signed)
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    He presented as a lateral transfer as [facility] ICU's were full and he reports symptoms of new cough, shortness of breath and fever for the last 2 weeks, he felt like he may have caught the flu and then subsequently developed a pneumonia.  At home his wife's caregiver noticed that he seemed confused and had a toxic appearance.   From chart review his initial presentation at [facility] ED he was febrile to 101.6 with hypoxemia to the mid 80's and hypotensive with bp of 70/30 with a leukocytosis.  Blood cultures were drawn, he was started on empiric HCAP treatment with vanc, cefepime and azithro due to CT abd/pelvis showing RLL infiltrate concerning for pneumonia.  he was not given fluid other than what was in the abx given heart failure.  He was started on dopamine as the initial pressor and became tachycardic and unresponsive.  This was stopped and he was placed on levo and his mental status and bp subsequently improved.  Chart review also mentions concern for possible UTI but on my review and history I feel this is less likely.    Subjective Feels a little more short of breath   Objective BP 95/61 (BP Location: Left Arm)   Pulse 69   Temp 98.7 F (37.1 C) (Oral)   Resp 17   Ht 5\' 7"  (1.702 m)   Wt 170 lb 13.7 oz (77.5 kg)   SpO2 96%   BMI 26.76 kg/m      Intake/Output Summary (Last 24 hours) at 04/15/2017 1050 Last data filed at 04/15/2017 0936 Gross per 24 hour  Intake 439.5 ml   Output 725 ml  Net -285.5 ml   Physical exam  General: This is a 77 year old white male resting in bed.  He does endorse increased shortness of breath and exhibit some mild accessory use when he talks HEENT: Normocephalic atraumatic does have jugular venous distention Pulmonary: Mild accessory use, decreased right greater than left base Cardiac: Regular rate and rhythm Abdomen: Soft nontender Extremities/musculoskeletal warm dry no significant edema brisk cap refill Neuro: Awake alert and oriented CBC Recent Labs    04/15/17 0408  WBC 15.1*  HGB 15.5  HCT 45.4  PLT 125*    Coag's Recent Labs    04/15/17 0408  INR 1.15    BMET Recent Labs    04/15/17 0408  NA 136  K 4.3  CL 97*  CO2 24  BUN 63*  CREATININE 2.68*  GLUCOSE 161*    Electrolytes Recent Labs    04/15/17 0408  CALCIUM 8.6*  MG 2.2  PHOS 3.5    Sepsis Markers Recent Labs    04/15/17 0408  PROCALCITON 0.71    ABG No results for input(s): PHART, PCO2ART, PO2ART in the last 72 hours.  Liver Enzymes Recent Labs    04/15/17 0408  AST 37  ALT 22  ALKPHOS 129*  BILITOT 4.5*  ALBUMIN 3.1*    Cardiac  Enzymes Recent Labs    04/15/17 0822  TROPONINI 0.14*    Glucose Recent Labs    04/15/17 0350 04/15/17 0758  GLUCAP 156* 109*    Imaging Dg Chest Port 1 View  Result Date: 04/15/2017 CLINICAL DATA:  Initial evaluation for acute hypoxia. EXAM: PORTABLE CHEST 1 VIEW COMPARISON:  Prior radiograph from 02/24/2017. FINDINGS: Median sternotomy wires with underlying CABG markers and surgical clips noted. Moderate cardiomegaly, similar to previous. Left-sided pacemaker/AICD in place. Aortic atherosclerosis. Lungs normally inflated. Patchy multifocal opacity within the right lung base, suspicious for possible infiltrate. Sequelae of aspiration could also be considered. Perihilar vascular congestion without pulmonary edema. No definite pleural effusion. No pneumothorax. No acute osseous  abnormality. IMPRESSION: 1. Patchy right basilar opacity, suspicious for possible infiltrate. Sequelae of aspiration could also be considered. 2. Cardiomegaly with mild perihilar vascular congestion without frank pulmonary edema. 3. Aortic atherosclerosis. Electronically Signed   By: Jeannine Boga M.D.   On: 04/15/2017 04:12   CULTURES: Blood cultures drawn at [facility] 1/23 before abx Blood cultures drawn here 1/24  ANTIBIOTICS: Vanc/cefepime/azithro 1/23 at [facility] (stopped 1/24) Rocephin 1/24>>> azith 1/24>>>  SIGNIFICANT EVENTS: tachcardia and unresponsive after dopamine administration at [facility]  LINES/TUBES: Peripheral IV's   Impression/plan  Chronic hypoxic respiratory failure (oxygen 2 liters at home) Worsening dyspnea  OSA Portable chest x-ray: Personally reviewed this shows right greater than left patchy airspace disease Plan PCXR now also check BNP KVO IVFs Oxygen as needed Cont pulse ox  CPAP at HS May need to consider diuresis  CAP (NOS) Plan Was on maxipime and vanc from outside hospital  Change to CAP coverage day # 1 rocephin and azith (stop dates placed) F/u urine antigens and RVP PCT algo   Resolved septic shock H/o HFrEF (EF 25%) Plan Holding antihypertensives Cont tele  KVO IVF s now that out of shock (accept MAP > 60)  AKI Plan Trend cbc Renal dose meds  F/u chemistry   H/o DVT/PE Plan Cont coumadin and starting bridge LMWH until we get INR > 1.8  Leukocytosis in setting of sepsis Plan Trend   Type II DM Plan ssi   H/o CVA and seizures Plan Cont supportive care Cont keppra   Acute encephalopathy Improved since admission to [facility] ACNP-BC [place] Pager # [phone] OR # [phone] if no answer  Attending Note:  77 year old male with PMH above presenting in respiratory failure with hypoxemia that is acute on chronic.  Patient was admitted to the ICU with th diagnosis  of CAP and hypotension.  Discussed with PCCM-NP.  Bibasilar crackles on exam.  I reviewed CXR myself, infiltrate noted.  If able to get to a reasonable O2 level then will transfer to SDU and to [facility] with PCCM off 1/25.  Continue rocephin and zithromax (stop dates in place).  KVO IVF.  Titrate O2 down for sat of 88-92%.  F/u on cultures.  CPAP while asleep.  Hold off anti-HTN.  BMET in AM and replace electrolytes as indicated.  The patient is critically ill with multiple organ systems failure and requires high complexity decision making for assessment and support, frequent evaluation and titration of therapies, application of advanced monitoring technologies and extensive interpretation of multiple databases.   Critical Care Time devoted to patient care services described in this note is  35  Minutes. This time reflects time of care of this signee Dr Jennet Maduro. This critical care time does not reflect  procedure time, or teaching time or supervisory time of PA/NP/Med student/Med Resident etc but could involve care discussion time.  Rush Farmer, M.D. [facility] Pulmonary/Critical Care Medicine. Pager: [phone]. After hours pager: [phone].

## 2017-04-15 NOTE — H&P (Signed)
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    He presented as a lateral transfer as [facility] and [facility] ICU's were full and he reports symptoms of new cough, shortness of breath and fever for the last 2 weeks, he felt like he may have caught the flu and then subsequently developed a pneumonia.  At home his wife's caregiver noticed that he seemed confused and had a toxic appearance.   From chart review his initial presentation at [facility] ED he was febrile to 101.6 with hypoxemia to the mid 80's and hypotensive with bp of 70/30 with a leukocytosis.  Blood cultures were drawn, he was started on empiric HCAP treatment with vanc, cefepime and azithro due to CT abd/pelvis showing RLL infiltrate concerning for pneumonia.  he was not given fluid other than what was in the abx given heart failure.  He was started on dopamine as the initial pressor and became tachycardic and unresponsive.  This was stopped and he was placed on levo and his mental status and bp subsequently improved.  Chart review also mentions concern for possible UTI but on my review and history I feel this is less likely.    PAST MEDICAL HISTORY :  He  has a past medical history of AICD (automatic cardioverter/defibrillator) present, CHF (congestive heart failure) ([place]), Chronic kidney disease, COPD (chronic obstructive pulmonary disease) ([place]), Coronary artery disease, CVA (cerebral vascular accident) ([place]), Diabetes mellitus, DVT (deep venous thrombosis) ([place]), GERD (gastroesophageal reflux disease),  Gout, History of blood transfusion, Hyperlipemia, Hypertension, Presence of cardiac defibrillator, Presence of permanent cardiac pacemaker, PVD (peripheral vascular disease) ([place]), Shortness of breath dyspnea, Sleep apnea, and TIA (transient ischemic attack).  PAST SURGICAL HISTORY: He  has a past surgical history that includes Bypass Graft; Cholecystectomy; Abdominal aortic aneurysm repair; Colonoscopy w/ polypectomy; Coronary artery bypass graft (1985, 2003); and I&D extremity (Left, 06/13/2014).  Allergies  Allergen Reactions  . Aspirin Anaphylaxis  . Ivp Dye [Iodinated Diagnostic Agents] Anaphylaxis    Blackout    No current facility-administered medications on file prior to encounter.    Current Outpatient Medications on File Prior to Encounter  Medication Sig  . allopurinol (ZYLOPRIM) 100 MG tablet Take 100 mg by mouth daily.  Marland Kitchen atorvastatin (LIPITOR) 80 MG tablet Take 80 mg by mouth daily at 6 PM.  . calcium carbonate (TUMS - DOSED IN MG ELEMENTAL CALCIUM) 500 MG chewable tablet Chew 1 tablet by mouth 2 (two) times daily.  . carvedilol (COREG) 3.125 MG tablet Take 3.125 mg by mouth 2 (two) times daily with a meal.  . cholecalciferol (VITAMIN D) 1000 UNITS tablet Take 1,000 Units by mouth daily.  Marland Kitchen docusate sodium (COLACE) 100 MG capsule Take 1 capsule (100 mg total) by mouth 2 (two) times daily.  . furosemide (LASIX) 10 MG/ML injection Inject 4 mLs (40 mg total) into the vein every 12 (twelve) hours.  . furosemide (LASIX) 40 MG tablet Take 40 mg by mouth daily as needed for fluid (40mg  - 80 mg). Prn Leg Swelling  . HYDROcodone-acetaminophen (NORCO/VICODIN) 5-325 MG per tablet Take  1 tablet by mouth every 4 (four) hours as needed for moderate pain.  Marland Kitchen insulin aspart (NOVOLOG) 100 UNIT/ML injection Inject 0-15 Units into the skin 3 (three) times daily with meals.  . insulin detemir (LEVEMIR) 100 UNIT/ML injection Inject 0.15 mLs (15 Units total) into the skin daily.  . isosorbide  mononitrate (IMDUR) 30 MG 24 hr tablet Take 30 mg by mouth daily.  Marland Kitchen levETIRAcetam (KEPPRA) 500 MG tablet Take 500 mg by mouth 2 (two) times daily. Patient not taking  . lisinopril (PRINIVIL,ZESTRIL) 2.5 MG tablet Take 2.5 mg by mouth daily.  . nitroGLYCERIN (NITROSTAT) 0.4 MG SL tablet Place 0.4 mg under the tongue every 5 (five) minutes as needed for chest pain.  Marland Kitchen oxyCODONE-acetaminophen (PERCOCET) 10-325 MG per tablet Take 1 tablet by mouth every 4 (four) hours as needed for pain.  . ramipril (ALTACE) 2.5 MG capsule Take 1 capsule (2.5 mg total) by mouth daily.  . ranitidine (ZANTAC) 75 MG tablet Take 75 mg by mouth 2 (two) times daily.  . ranolazine (RANEXA) 500 MG 12 hr tablet Take 500 mg by mouth 2 (two) times daily.  . simvastatin (ZOCOR) 40 MG tablet Take 40 mg by mouth every evening.  . temazepam (RESTORIL) 15 MG capsule Take 15 mg by mouth at bedtime.  . vitamin C (ASCORBIC ACID) 500 MG tablet Take 500 mg by mouth daily.  . Vitamin D, Ergocalciferol, (DRISDOL) 50000 UNITS CAPS capsule Take 50,000 Units by mouth every 7 (seven) days.  Marland Kitchen warfarin (COUMADIN) 5 MG tablet Take 5 mg by mouth daily.  Marland Kitchen warfarin (COUMADIN) 5 MG tablet Take 5 mg by mouth daily.    FAMILY HISTORY:  His indicated that the status of his mother is unknown. He indicated that the status of his father is unknown. He indicated that the status of his brother is unknown. He indicated that the status of his maternal grandmother is unknown. He indicated that the status of his maternal grandfather is unknown.   SOCIAL HISTORY: He  reports that he has quit smoking. he has never used smokeless tobacco. He reports that he drinks alcohol. He reports that he does not use drugs.  REVIEW OF SYSTEMS:   ROS: Pulmonary: pt denies increased work of breathing, does feel slightly short of breath Cardiac: pt denies palpitations, chest pain,  Abdominal: pt endorses lower abdominal pain, but no nausea, vomiting, or  diarrhea  SUBJECTIVE:  77 yo male presenting with septic shock with possible source of pneumonia and transferred here from [facility] on levo after getting broad spectrum abx.    VITAL SIGNS: BP 135/75 (BP Location: Left Arm)   Pulse 72   Temp 98.4 F (36.9 C) (Oral)   Resp 14   Ht 5\' 7"  (1.702 m)   Wt 170 lb 13.7 oz (77.5 kg)   SpO2 98%   BMI 26.76 kg/m   HEMODYNAMICS:    VENTILATOR SETTINGS:    INTAKE / OUTPUT: No intake/output data recorded.  PHYSICAL EXAMINATION: Physical Exam  Constitutional: He is oriented to person, place, and time. He appears well-developed and well-nourished.  HENT:  Head: Normocephalic and atraumatic.  Eyes: Right eye exhibits no discharge. Left eye exhibits no discharge. No scleral icterus.  Cardiovascular: Normal rate, regular rhythm and intact distal pulses. Exam reveals no gallop and no friction rub.  Murmur (2/6 systolic) heard. Pulmonary/Chest: Effort normal and breath sounds normal. No respiratory distress. He has no wheezes. He has no rales.  Abdominal: Soft. Bowel sounds are normal. He  exhibits no distension and no mass. There is tenderness (diffuse lower quadrants). There is no guarding.  Musculoskeletal: He exhibits edema (minimal).  Neurological: He is alert and oriented to person, place, and time.   LABS:  BMET No results for input(s): NA, K, CL, CO2, BUN, CREATININE, GLUCOSE in the last 168 hours.  Electrolytes No results for input(s): CALCIUM, MG, PHOS in the last 168 hours.  CBC No results for input(s): WBC, HGB, HCT, PLT in the last 168 hours.  Coag's No results for input(s): APTT, INR in the last 168 hours.  Sepsis Markers No results for input(s): LATICACIDVEN, PROCALCITON, O2SATVEN in the last 168 hours.  ABG No results for input(s): PHART, PCO2ART, PO2ART in the last 168 hours.  Liver Enzymes No results for input(s): AST, ALT, ALKPHOS, BILITOT, ALBUMIN in the last 168 hours.  Cardiac Enzymes No results for  input(s): TROPONINI, PROBNP in the last 168 hours.  Glucose Recent Labs  Lab 04/15/17 0350  GLUCAP 156*    Imaging Dg Chest Port 1 View  Result Date: 04/15/2017 CLINICAL DATA:  Initial evaluation for acute hypoxia. EXAM: PORTABLE CHEST 1 VIEW COMPARISON:  Prior radiograph from 02/24/2017. FINDINGS: Median sternotomy wires with underlying CABG markers and surgical clips noted. Moderate cardiomegaly, similar to previous. Left-sided pacemaker/AICD in place. Aortic atherosclerosis. Lungs normally inflated. Patchy multifocal opacity within the right lung base, suspicious for possible infiltrate. Sequelae of aspiration could also be considered. Perihilar vascular congestion without pulmonary edema. No definite pleural effusion. No pneumothorax. No acute osseous abnormality. IMPRESSION: 1. Patchy right basilar opacity, suspicious for possible infiltrate. Sequelae of aspiration could also be considered. 2. Cardiomegaly with mild perihilar vascular congestion without frank pulmonary edema. 3. Aortic atherosclerosis. Electronically Signed   By: Jeannine Boga M.D.   On: 04/15/2017 04:12       CULTURES: Blood cultures drawn at [facility] 1/23 before abx Blood cultures drawn here 1/24  ANTIBIOTICS: Vanc[facility]cefepime[facility]ithro [facility]  SIGNIFICANT EVENTS: tachcardia and unresponsive after dopamine administration at [facility]  LINES/TUBES: Peripheral IV's    ASSESSMENT / PLAN:  PULMONARY A: On 2L [place] at home chronically, saturating well here on [facility] Possible pneumonia 2/2 viral infection RLL infiltrate seen on CT OSA  P:   Continue 2L [place]  CPAP  CARDIOVASCULAR A:  Pt arrived on minimal levo Has HFrEF History of DVT, PE P:  bp improving, titrating off levo Keep an eye on fluid status, add lasix as needed Continue warfarin  RENAL A:   AKI  P:   Continue to monitor Likely prerenal and will improve with BP improvement  GASTROINTESTINAL A:   No acute issues P:   NTD, if passes  bedside swallow can eat  HEMATOLOGIC A:   Leukocytosis with neutrophil predominance Mild thrombocytopenia P:  Continue to monitor  INFECTIOUS A:   Septic shock 2/2 HCAP CBC with leukocytosis and neutrophil predominance P:   Respiratory viral panel Strep pneumo/legionella antigens ordered Continue abx above for now Trend lactate procalcitonin Repeat UA Sputum culture  ENDOCRINE A:   T2DM poorly controlled  P:   ICU SSI q4  NEUROLOGIC A:   Alert, following commands, Has extensive CVA history  Seizure history Head [facility] at [facility] neg for acute abnormality  P:   Will get keppra levels Continue antiseizure meds    FAMILY  - Updates:   - Inter-disciplinary family meet or Palliative Care meeting due by:  day 7    Pulmonary and [place] Pager: [phone]  04/15/2017, 4:16 AM

## 2017-04-15 NOTE — Care Management Note (Signed)
Case Management Note Marvetta Gibbons RN, BSN Unit 4E-Case Manager-- [place] coverage [phone]  Patient Details  Name: [name] MRN: [id number] Date of Birth: 1940/08/20  Subjective/Objective:   Pt presented as a lateral transfer as [facility]- admitted with septic shock                 Action/Plan: PTA pt lived at home with wife (who has caregiver at home)- CM to follow for transition of care needs   Expected Discharge Date:                  Expected Discharge Plan:     In-House Referral:     Discharge planning Services  CM Consult  Post Acute Care Choice:    Choice offered to:     DME Arranged:    DME Agency:     HH Arranged:    [place] Agency:     Status of Service:  In process, will continue to follow  If discussed at Long Length of Stay Meetings, dates discussed:    Discharge Disposition:   Additional Comments:  Curtis Patricia, RN 04/15/2017, 11:55 AM

## 2017-04-15 NOTE — Progress Notes (Signed)
Pharmacy Antibiotic + Anticoagulation Note  [name] is a 76 y.o. male admitted on 04/15/2017 from outside hospital with ?sepsis. Antibiotics were given at previous hospital immediately prior to transfer - he received vancomycin 1500 mg and cefepime 1 g both at 2300 on 1/23. Labs also show AKI with eCrCl 20-30 ml/min.  On warfarin PTA - unclear what prior to admission dose is and when last dose was. INR pending.  Plan: -Vancomycin 750 mg IV q24h -Cefepime 1 g IV q24h -Monitor renal fx, cultures, VR as needed  -Warfarin dose to be entered later today, f/u INR   No results for input(s): WBC, CREATININE, LATICACIDVEN, VANCOTROUGH, VANCOPEAK, VANCORANDOM, GENTTROUGH, GENTPEAK, GENTRANDOM, TOBRATROUGH, TOBRAPEAK, TOBRARND, AMIKACINPEAK, AMIKACINTROU, AMIKACIN in the last 168 hours.  CrCl cannot be calculated (Patient's most recent lab result is older than the maximum 21 days allowed.).    Allergies  Allergen Reactions  . Aspirin Anaphylaxis  . Ivp Dye [Iodinated Diagnostic Agents] Anaphylaxis    Blackout    Antimicrobials this admission: 1/24 cefepime > 1/24 vancomycin >  Dose adjustments this admission: N/A  Microbiology results: 1/24 resp cx: 1/24 blood cx: 1/24 mrsa pcr:   [name] 04/15/2017 3:54 AM

## 2017-04-16 ENCOUNTER — Inpatient Hospital Stay (HOSPITAL_COMMUNITY): Payer: Medicare Other

## 2017-04-16 DIAGNOSIS — J189 Pneumonia, unspecified organism: Secondary | ICD-10-CM

## 2017-04-16 DIAGNOSIS — J181 Lobar pneumonia, unspecified organism: Secondary | ICD-10-CM

## 2017-04-16 LAB — GLUCOSE, CAPILLARY
GLUCOSE-CAPILLARY: 124 mg/dL — AB (ref 65–99)
GLUCOSE-CAPILLARY: 232 mg/dL — AB (ref 65–99)
Glucose-Capillary: 215 mg/dL — ABNORMAL HIGH (ref 65–99)
Glucose-Capillary: 176 mg/dL — ABNORMAL HIGH (ref 65–99)

## 2017-04-16 LAB — COMPREHENSIVE METABOLIC PANEL
ALT: 20 U/L (ref 17–63)
ANION GAP: 13 (ref 5–15)
AST: 27 U/L (ref 15–41)
Albumin: 2.9 g/dL — ABNORMAL LOW (ref 3.5–5.0)
Alkaline Phosphatase: 141 U/L — ABNORMAL HIGH (ref 38–126)
BILIRUBIN TOTAL: 3.2 mg/dL — AB (ref 0.3–1.2)
BUN: 72 mg/dL — AB (ref 6–20)
CO2: 23 mmol/L (ref 22–32)
Calcium: 8.4 mg/dL — ABNORMAL LOW (ref 8.9–10.3)
Chloride: 98 mmol/L — ABNORMAL LOW (ref 101–111)
Creatinine, Ser: 2.71 mg/dL — ABNORMAL HIGH (ref 0.61–1.24)
GFR calc Af Amer: 24 mL/min — ABNORMAL LOW (ref >60)
GFR, EST NON AFRICAN AMERICAN: 21 mL/min — AB (ref >60)
Glucose, Bld: 158 mg/dL — ABNORMAL HIGH (ref 65–99)
POTASSIUM: 3.7 mmol/L (ref 3.5–5.1)
Sodium: 134 mmol/L — ABNORMAL LOW (ref 135–145)
TOTAL PROTEIN: 6.7 g/dL (ref 6.5–8.1)

## 2017-04-16 LAB — CBC
HEMATOCRIT: 37.4 % — AB (ref 39.0–52.0)
Hemoglobin: 12.7 g/dL — ABNORMAL LOW (ref 13.0–17.0)
MCH: 31.8 pg (ref 26.0–34.0)
MCHC: 34 g/dL (ref 30.0–36.0)
MCV: 93.5 fL (ref 78.0–100.0)
Platelets: 96 10*3/uL — ABNORMAL LOW (ref 150–400)
RBC: 4 MIL/uL — ABNORMAL LOW (ref 4.22–5.81)
RDW: 14.2 % (ref 11.5–15.5)
WBC: 11 10*3/uL — ABNORMAL HIGH (ref 4.0–10.5)

## 2017-04-16 LAB — BILIRUBIN, FRACTIONATED(TOT/DIR/INDIR)
BILIRUBIN TOTAL: 3 mg/dL — AB (ref 0.3–1.2)
Bilirubin, Direct: 1 mg/dL — ABNORMAL HIGH (ref 0.1–0.5)
Indirect Bilirubin: 2 mg/dL — ABNORMAL HIGH (ref 0.3–0.9)

## 2017-04-16 LAB — LEGIONELLA PNEUMOPHILA SEROGP 1 UR AG: L. PNEUMOPHILA SEROGP 1 UR AG: NEGATIVE

## 2017-04-16 LAB — PROTIME-INR
INR: 1.38
Prothrombin Time: 16.8 seconds — ABNORMAL HIGH (ref 11.4–15.2)

## 2017-04-16 MED ORDER — ATORVASTATIN CALCIUM 40 MG PO TABS
40.0000 mg | ORAL_TABLET | Freq: Every day | ORAL | Status: DC
  Administered 2017-04-16 – 2017-04-21 (×6): 40 mg via ORAL
  Filled 2017-04-16 (×6): qty 1

## 2017-04-16 MED ORDER — AZITHROMYCIN 500 MG PO TABS
500.0000 mg | ORAL_TABLET | Freq: Every day | ORAL | Status: DC
  Administered 2017-04-16 – 2017-04-18 (×3): 500 mg via ORAL
  Filled 2017-04-16 (×4): qty 1

## 2017-04-16 MED ORDER — TORSEMIDE 20 MG PO TABS
50.0000 mg | ORAL_TABLET | Freq: Two times a day (BID) | ORAL | Status: DC
  Administered 2017-04-16 – 2017-04-21 (×10): 50 mg via ORAL
  Filled 2017-04-16 (×10): qty 3

## 2017-04-16 MED ORDER — WARFARIN SODIUM 3 MG PO TABS
6.0000 mg | ORAL_TABLET | Freq: Once | ORAL | Status: AC
  Administered 2017-04-16: 6 mg via ORAL
  Filled 2017-04-16: qty 2

## 2017-04-16 NOTE — Evaluation (Signed)
Physical Therapy Evaluation Patient Details Name: [name] MRN: [id number] DOB: October 30, 1940 Today's Date: 04/16/2017   History of Present Illness  pt is a 77 y/o male with pmh significant for CKD, ICM, CAD post CABG, multiple CVA's, recurrent DVT/PE's, OSA and AAA repair admitted as a lateral transfer from [facility] due to s/s of flu then development of PNA.  Clinical Impression  Pt admitted with/for s/s of flu or cold and PNA.  Pt now needing significant assist for all mobility.  Pt currently limited functionally due to the problems listed. ( See problems list.)   Pt will benefit from PT to maximize function and safety in order to get ready for next venue listed below.     Follow Up Recommendations SNF;Supervision/Assistance - 24 hour    Equipment Recommendations  Other (comment)(TBA)    Recommendations for Other Services       Precautions / Restrictions Precautions Precautions: Fall Restrictions Weight Bearing Restrictions: No      Mobility  Bed Mobility Overal bed mobility: Needs Assistance Bed Mobility: Sit to Supine;Supine to Sit     Supine to sit: Total assist Sit to supine: Total assist   General bed mobility comments: pt was just listless and subjectively felt feverish.  pt unable to assist much and needed significant truncal and scoot assist  Transfers Overall transfer level: Needs assistance Equipment used: Rolling walker (2 wheeled) Transfers: Sit to/from Stand Sit to Stand: Max assist         General transfer comment: significant boost assist to get pt fully upright, but pt unable to w/shift of step   Ambulation/Gait             General Gait Details: stood only unable to tolerate unilateral stance  Stairs            Wheelchair Mobility    Modified Rankin (Stroke Patients Only)       Balance Overall balance assessment: Needs assistance   Sitting balance-Leahy Scale: Poor Sitting balance - Comments: tended to list various  directions.     Standing balance-Leahy Scale: Poor Standing balance comment: needed external support.                             Pertinent Vitals/Pain Pain Assessment: Faces Faces Pain Scale: No hurt Pain Intervention(s): Monitored during session    Home Living Family/patient expects to be discharged to:: Private residence Living Arrangements: Alone(per pt, but the chart talks of a wife.) Available Help at Discharge: Other (Comment)(none per pt.) Type of Home: Apartment Home Access: Stairs to enter     Home Layout: One level Home Equipment: Environmental consultant - 2 wheels Additional Comments: Limited home or PLOF information    Prior Function Level of Independence: (pt reports that he was independent with RW at home)               Hand Dominance        Extremity/Trunk Assessment   Upper Extremity Assessment Upper Extremity Assessment: Generalized weakness    Lower Extremity Assessment Lower Extremity Assessment: Generalized weakness       Communication   Communication: No difficulties  Cognition Arousal/Alertness: Lethargic Behavior During Therapy: WFL for tasks assessed/performed Overall Cognitive Status: No family/caregiver present to determine baseline cognitive functioning  General Comments General comments (skin integrity, edema, etc.): sats maintain on oxygen in low to mid 90's    Exercises     Assessment/Plan    PT Assessment Patient needs continued PT services  PT Problem List Decreased strength;Decreased activity tolerance;Decreased balance;Decreased mobility;Decreased knowledge of use of DME;Cardiopulmonary status limiting activity       PT Treatment Interventions DME instruction;Gait training;Functional mobility training;Therapeutic activities;Balance training;Therapeutic exercise;Patient/family education    PT Goals (Current goals can be found in the Care Plan section)  Acute Rehab  PT Goals Patient Stated Goal: pt did not participate PT Goal Formulation: Patient unable to participate in goal setting Time For Goal Achievement: 04/30/17 Potential to Achieve Goals: Fair    Frequency Min 3X/week   Barriers to discharge   pt states lives alone and has no outside assist    Co-evaluation               AM-PAC PT "6 Clicks" Daily Activity  Outcome Measure Difficulty turning over in bed (including adjusting bedclothes, sheets and blankets)?: Unable Difficulty moving from lying on back to sitting on the side of the bed? : Unable Difficulty sitting down on and standing up from a chair with arms (e.g., wheelchair, bedside commode, etc,.)?: Unable Help needed moving to and from a bed to chair (including a wheelchair)?: A Lot Help needed walking in hospital room?: A Lot Help needed climbing 3-5 steps with a railing? : A Lot 6 Click Score: 9    End of Session   Activity Tolerance: Patient limited by fatigue Patient left: in bed;with call bell/phone within reach;with bed alarm set Nurse Communication: Mobility status PT Visit Diagnosis: Other abnormalities of gait and mobility (R26.89);Muscle weakness (generalized) (M62.81);Ataxic gait (R26.0)    Time: 3825-0539 PT Time Calculation (min) (ACUTE ONLY): 17 min   Charges:   PT Evaluation $PT Eval High Complexity: 1 High     PT G Codes:        April 25, 2017  Donnella Sham, PT [phone]  (pager)  [name] 04/25/17, 3:49 PM

## 2017-04-16 NOTE — Progress Notes (Signed)
PROGRESS NOTE    [name]  WRU:[id number] DOB: 08-21-1940 DOA: 04/15/2017 PCP: Octavio Graves, DO   Brief Narrative: [name] is a 77 y.o. 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 Patient presented with hypotension and encephalopathy. He was initially transferred to the ICU for vasopressor support and was quickly weaned off. He was found to have community acquired pneumonia and is on ceftriaxone/azithromycin for treatment. He was transferred out of the ICU on 1/25. PT/OT evals pending. Cultures pending.   Assessment & Plan:   Active Problems:   Septic shock (HCC)   RLL pneumonia ([place])   Septic shock Present on admission. Secondary to RLL pneumonia. Possibly some iatrogenic component. Required Levophed for a few hours in the ICU and weaned off successfully. Not intubated. Transferred out of the ICU on 1/25. -Continue treatment of pneumonia  RLL community acquired pneumonia Improving.On room air. Blood culture (1/24) pending. RVP negative. Sputum culture gram stain significant for rare gram positive cocci. Strep pneumo antigen negative. -Continue Ceftriaxone and azithromycin -PT/OT eval -Repeat imaging in 3-4 weeks to ensure no underlying mass  Chronic systolic heart failure Last EF of 25-30% with apical/diffuse hypokinesis from 2018 echocardiogram. Patient is s/p ICD. -Restart torsemide  Essential hypertension Patient presented with hypotension. Holding antihypertensives (Coreg, Lasix, Lisinopril, Metolazone, Imdur, ramipril)  Diabetes mellitus, insulin dependent Fairly controlled. -Continue Lantus and SSI  CAD Atherosclerotic disease seen on recent CT. -Continue Lipitor  Pulmonary artery hypertension Moderate-severe. -outpatient follow-up  History of DVTs/PE -Continue  Coumadin  History of CVA -Continue Coumadin  Hyperbilirubinemia Unknown etiology. Both direct/indirect elevated. CT scan from OSH was negative for liver pathology. No elevated transaminases. Alkaline phosphatase elevated but patient has no gallbladder.   DVT prophylaxis: Lovenox Code Status: Full code Family Communication: None at bedside Disposition Plan: Discharge in 24-48 hours   Consultants:   PCCM  Procedures:   None  Antimicrobials:  Vancomycin  Cefepime  Azithromycin  Ceftriaxone    Subjective: No chest pain or dyspnea today. Coughing.  Objective: Vitals:   04/15/17 2325 04/16/17 0113 04/16/17 0650 04/16/17 0653  BP:  104/63 106/73   Pulse: 70 83 77   Resp: 20 20 18    Temp:   98.6 F (37 C)   TempSrc:   Oral   SpO2: 97% 96% 95%   Weight:   80.7 kg (177 lb 14.6 oz) 80.2 kg (176 lb 11.2 oz)  Height:        Intake/Output Summary (Last 24 hours) at 04/16/2017 0744 Last data filed at 04/16/2017 0733 Gross per 24 hour  Intake 980 ml  Output 1325 ml  Net -345 ml   Filed Weights   04/15/17 2247 04/16/17 0650 04/16/17 0653  Weight: 80.3 kg (177 lb 0.5 oz) 80.7 kg (177 lb 14.6 oz) 80.2 kg (176 lb 11.2 oz)    Examination:  General exam: Appears calm and comfortable Respiratory system: Clear to auscultation with diminished breath sounds on RLL. Respiratory effort normal. Cardiovascular system: S1 & S2 heard, RRR. No murmurs, rubs, gallops or clicks. Gastrointestinal system: Abdomen is nondistended, soft and nontender. No organomegaly or masses felt. Normal bowel sounds heard. Central nervous system: Alert and oriented. No focal neurological deficits. Extremities: No edema. No calf tenderness Skin: No cyanosis. No rashes Psychiatry: Judgement and insight appear normal. Mood & affect appropriate.  Data Reviewed: I have personally reviewed following labs and imaging studies  CBC: Recent Labs  Lab 04/15/17 0408 04/16/17 0605  WBC 15.1* 11.0*   NEUTROABS 11.8*  --   HGB 15.5 12.7*  HCT 45.4 37.4*  MCV 94.6 93.5  PLT 125* PENDING   Basic Metabolic Panel: Recent Labs  Lab 04/15/17 0408 04/16/17 0605  NA 136 134*  K 4.3 3.7  CL 97* 98*  CO2 24 23  GLUCOSE 161* 158*  BUN 63* 72*  CREATININE 2.68* 2.71*  CALCIUM 8.6* 8.4*  MG 2.2  --   PHOS 3.5  --    GFR: Estimated Creatinine Clearance: 23.2 mL/min (A) (by C-G formula based on SCr of 2.71 mg/dL (H)). Liver Function Tests: Recent Labs  Lab 04/15/17 0408 04/16/17 0605  AST 37 27  ALT 22 20  ALKPHOS 129* 141*  BILITOT 4.5* 3.2*  PROT 7.5 6.7  ALBUMIN 3.1* 2.9*   No results for input(s): LIPASE, AMYLASE in the last 168 hours. No results for input(s): AMMONIA in the last 168 hours. Coagulation Profile: Recent Labs  Lab 04/15/17 0408 04/16/17 0605  INR 1.15 1.38   Cardiac Enzymes: Recent Labs  Lab 04/15/17 0822  TROPONINI 0.14*   BNP (last 3 results) No results for input(s): PROBNP in the last 8760 hours. HbA1C: No results for input(s): HGBA1C in the last 72 hours. CBG: Recent Labs  Lab 04/15/17 0758 04/15/17 1134 04/15/17 1745 04/15/17 2014 04/15/17 2209  GLUCAP 109* 223* 308* 328* 281*   Lipid Profile: No results for input(s): CHOL, HDL, LDLCALC, TRIG, CHOLHDL, LDLDIRECT in the last 72 hours. Thyroid Function Tests: No results for input(s): TSH, T4TOTAL, FREET4, T3FREE, THYROIDAB in the last 72 hours. Anemia Panel: No results for input(s): VITAMINB12, FOLATE, FERRITIN, TIBC, IRON, RETICCTPCT in the last 72 hours. Sepsis Labs: Recent Labs  Lab 04/15/17 0408  PROCALCITON 0.71  LATICACIDVEN 1.3    Recent Results (from the past 240 hour(s))  Respiratory Panel by PCR     Status: None   Collection Time: 04/15/17  3:47 AM  Result Value Ref Range Status   Adenovirus NOT DETECTED NOT DETECTED Final   Coronavirus 229E NOT DETECTED NOT DETECTED Final   Coronavirus HKU1 NOT DETECTED NOT DETECTED Final   Coronavirus NL63 NOT DETECTED NOT  DETECTED Final   Coronavirus OC43 NOT DETECTED NOT DETECTED Final   Metapneumovirus NOT DETECTED NOT DETECTED Final   Rhinovirus / Enterovirus NOT DETECTED NOT DETECTED Final   Influenza A NOT DETECTED NOT DETECTED Final   Influenza B NOT DETECTED NOT DETECTED Final   Parainfluenza Virus 1 NOT DETECTED NOT DETECTED Final   Parainfluenza Virus 2 NOT DETECTED NOT DETECTED Final   Parainfluenza Virus 3 NOT DETECTED NOT DETECTED Final   Parainfluenza Virus 4 NOT DETECTED NOT DETECTED Final   Respiratory Syncytial Virus NOT DETECTED NOT DETECTED Final   Bordetella pertussis NOT DETECTED NOT DETECTED Final   Chlamydophila pneumoniae NOT DETECTED NOT DETECTED Final   Mycoplasma pneumoniae NOT DETECTED NOT DETECTED Final  MRSA PCR Screening     Status: None   Collection Time: 04/15/17  3:47 AM  Result Value Ref Range Status   MRSA by PCR NEGATIVE NEGATIVE Final    Comment:        The GeneXpert MRSA Assay (FDA approved for NASAL specimens only), is one component of a comprehensive MRSA colonization surveillance program. It is not intended to diagnose MRSA infection nor to guide or monitor treatment for MRSA infections.  Culture, expectorated sputum-assessment     Status: None   Collection Time: 04/15/17  9:10 AM  Result Value Ref Range Status   Specimen Description Expect. Sput  Final   Special Requests NONE  Final   Sputum evaluation THIS SPECIMEN IS ACCEPTABLE FOR SPUTUM CULTURE  Final   Report Status 04/15/2017 FINAL  Final  Culture, respiratory (NON-Expectorated)     Status: None (Preliminary result)   Collection Time: 04/15/17  9:10 AM  Result Value Ref Range Status   Specimen Description Expect. Sput  Final   Special Requests NONE Reflexed from [id number]  Final   Gram Stain   Final    RARE WBC PRESENT,BOTH PMN AND MONONUCLEAR RARE GRAM POSITIVE COCCI    Culture PENDING  Incomplete   Report Status PENDING  Incomplete         Radiology Studies: Dg Chest Port 1  View  Result Date: 04/15/2017 CLINICAL DATA:  Chest pain EXAM: PORTABLE CHEST 1 VIEW COMPARISON:  04/15/2017 FINDINGS: 1148 hours. The cardio pericardial silhouette is enlarged. Interstitial markings are diffusely coarsened with chronic features. Similar appearance of the patchy bibasilar airspace opacity, right greater than left. Permanent pacer/AICD again noted. The visualized bony structures of the thorax are intact. Telemetry leads overlie the chest. IMPRESSION: 1. Stable exam with cardiomegaly and diffuse chronic interstitial changes. 2. Patchy basilar airspace disease bilaterally, right greater than left is similar to prior study. Electronically Signed   By: Misty Stanley M.D.   On: 04/15/2017 12:07   Dg Chest Port 1 View  Result Date: 04/15/2017 CLINICAL DATA:  Initial evaluation for acute hypoxia. EXAM: PORTABLE CHEST 1 VIEW COMPARISON:  Prior radiograph from 02/24/2017. FINDINGS: Median sternotomy wires with underlying CABG markers and surgical clips noted. Moderate cardiomegaly, similar to previous. Left-sided pacemaker/AICD in place. Aortic atherosclerosis. Lungs normally inflated. Patchy multifocal opacity within the right lung base, suspicious for possible infiltrate. Sequelae of aspiration could also be considered. Perihilar vascular congestion without pulmonary edema. No definite pleural effusion. No pneumothorax. No acute osseous abnormality. IMPRESSION: 1. Patchy right basilar opacity, suspicious for possible infiltrate. Sequelae of aspiration could also be considered. 2. Cardiomegaly with mild perihilar vascular congestion without frank pulmonary edema. 3. Aortic atherosclerosis. Electronically Signed   By: Jeannine Boga M.D.   On: 04/15/2017 04:12        Scheduled Meds: . enoxaparin (LOVENOX) injection  1 mg/kg Subcutaneous Q24H  . insulin aspart  0-15 Units Subcutaneous TID WC  . insulin aspart  0-5 Units Subcutaneous QHS  . insulin glargine  10 Units Subcutaneous QHS  .  levETIRAcetam  500 mg Oral BID  . mouth rinse  15 mL Mouth Rinse BID  . senna-docusate  1 tablet Oral BID  . Warfarin - Pharmacist Dosing Inpatient   Does not apply q1800   Continuous Infusions: . sodium chloride    . azithromycin Stopped (04/15/17 1222)  . cefTRIAXone (ROCEPHIN)  IV Stopped (04/15/17 2337)     LOS: 1 day     Cordelia Poche, MD [facility] 04/16/2017, 7:44 AM Pager: [phone]  If 7PM-7AM, please contact night-coverage [email or web address] Password [facility] 04/16/2017, 7:44 AM

## 2017-04-16 NOTE — Progress Notes (Signed)
[place] for warfarin + enoxaparin Indication: hx DVT/PEs  Allergies  Allergen Reactions  . Aspirin Anaphylaxis  . Ivp Dye [Iodinated Diagnostic Agents] Anaphylaxis    Blackout  . Heparin Other (See Comments)    Patient does not know what problem was, it occurred at [facility], but this is not in [place] Patient does not know what problem was, it occurred at [facility], but this is not in [place]     Patient Measurements: Height: 5\' 7"  (170.2 cm) Weight: 176 lb 11.2 oz (80.2 kg)(b scale with one assist and a walker) IBW/kg (Calculated) : 66.1 Heparin Dosing Weight: 77.5 kg  Vital Signs: Temp: 98.6 F (37 C) (01/25 0650) Temp Source: Oral (01/25 0650) BP: 106/73 (01/25 0650) Pulse Rate: 77 (01/25 0650)  Labs: Recent Labs    04/15/17 0408 04/15/17 0822 04/16/17 0605  HGB 15.5  --  12.7*  HCT 45.4  --  37.4*  PLT 125*  --  96*  LABPROT 14.6  --  16.8*  INR 1.15  --  1.38  CREATININE 2.68*  --  2.71*  TROPONINI  --  0.14*  --     Estimated Creatinine Clearance: 23.2 mL/min (A) (by C-G formula based on SCr of 2.71 mg/dL (H)).   Medical History: Past Medical History:  Diagnosis Date  . AICD (automatic cardioverter/defibrillator) present   . CHF (congestive heart failure) ([place])   . Chronic kidney disease   . COPD (chronic obstructive pulmonary disease) ([place])   . Coronary artery disease   . CVA (cerebral vascular accident) ([place])    6 . no residual  . Diabetes mellitus    TYpe II  . DVT (deep venous thrombosis) ([place])   . GERD (gastroesophageal reflux disease)   . Gout   . History of blood transfusion   . Hyperlipemia   . Hypertension   . Presence of cardiac defibrillator   . Presence of permanent cardiac pacemaker   . PVD (peripheral vascular disease) ([place])   . Shortness of breath dyspnea    with exertion  . Sleep apnea   . TIA (transient ischemic attack)     Medications:   Scheduled:  . azithromycin  500 mg Oral Daily  . enoxaparin (LOVENOX) injection  1 mg/kg Subcutaneous Q24H  . insulin aspart  0-15 Units Subcutaneous TID WC  . insulin aspart  0-5 Units Subcutaneous QHS  . insulin glargine  10 Units Subcutaneous QHS  . levETIRAcetam  500 mg Oral BID  . mouth rinse  15 mL Mouth Rinse BID  . senna-docusate  1 tablet Oral BID  . Warfarin - Pharmacist Dosing Inpatient   Does not apply q1800    Assessment: 37 yom transferred from OSH with concerns for sepsis. Was presenting with confusion, weakness, and rigors; hypotension and fever noted at OSH. On warfarin PTA for history of multiple DVTs and PEs. Patient unsure of dose last time took; however, last dose was on 1/22.  Home regimen is 5 mg and 2.5 mg alternating, confirmed with patient.  INR 1.38, slowly trending up  Goal of Therapy:  INR 2-3 Monitor platelets by anticoagulation protocol: Yes   Plan:  Repeat warfarin 6 mg once tonight Enoxaparin 80 mg once daily until INR 1.8 Monitor daily INR and CBC Monitor signs/symptoms of bleeding  Thank you Anette Guarneri, PharmD [phone] 04/16/2017,9:43 AM

## 2017-04-17 ENCOUNTER — Encounter (HOSPITAL_COMMUNITY): Payer: Self-pay

## 2017-04-17 ENCOUNTER — Other Ambulatory Visit: Payer: Self-pay

## 2017-04-17 ENCOUNTER — Inpatient Hospital Stay (HOSPITAL_COMMUNITY): Payer: Medicare Other

## 2017-04-17 LAB — BASIC METABOLIC PANEL
ANION GAP: 14 (ref 5–15)
BUN: 66 mg/dL — ABNORMAL HIGH (ref 6–20)
CALCIUM: 8.3 mg/dL — AB (ref 8.9–10.3)
CO2: 22 mmol/L (ref 22–32)
CREATININE: 2.44 mg/dL — AB (ref 0.61–1.24)
Chloride: 96 mmol/L — ABNORMAL LOW (ref 101–111)
GFR, EST AFRICAN AMERICAN: 28 mL/min — AB (ref >60)
GFR, EST NON AFRICAN AMERICAN: 24 mL/min — AB (ref >60)
Glucose, Bld: 275 mg/dL — ABNORMAL HIGH (ref 65–99)
Potassium: 4.1 mmol/L (ref 3.5–5.1)
SODIUM: 132 mmol/L — AB (ref 135–145)

## 2017-04-17 LAB — GLUCOSE, CAPILLARY
GLUCOSE-CAPILLARY: 180 mg/dL — AB (ref 65–99)
GLUCOSE-CAPILLARY: 259 mg/dL — AB (ref 65–99)
GLUCOSE-CAPILLARY: 232 mg/dL — AB (ref 65–99)
Glucose-Capillary: 209 mg/dL — ABNORMAL HIGH (ref 65–99)

## 2017-04-17 LAB — CBC
HCT: 38.6 % — ABNORMAL LOW (ref 39.0–52.0)
HEMOGLOBIN: 13.3 g/dL (ref 13.0–17.0)
MCH: 32.1 pg (ref 26.0–34.0)
MCHC: 34.5 g/dL (ref 30.0–36.0)
MCV: 93.2 fL (ref 78.0–100.0)
Platelets: 118 10*3/uL — ABNORMAL LOW (ref 150–400)
RBC: 4.14 MIL/uL — AB (ref 4.22–5.81)
RDW: 14.2 % (ref 11.5–15.5)
WBC: 14.3 10*3/uL — AB (ref 4.0–10.5)

## 2017-04-17 LAB — PROTIME-INR
INR: 1.51
PROTHROMBIN TIME: 18.1 s — AB (ref 11.4–15.2)

## 2017-04-17 LAB — CULTURE, RESPIRATORY W GRAM STAIN: Culture: NORMAL

## 2017-04-17 LAB — CULTURE, RESPIRATORY

## 2017-04-17 MED ORDER — WARFARIN SODIUM 7.5 MG PO TABS
7.5000 mg | ORAL_TABLET | Freq: Once | ORAL | Status: AC
  Administered 2017-04-17: 7.5 mg via ORAL
  Filled 2017-04-17: qty 1

## 2017-04-17 MED ORDER — FUROSEMIDE 10 MG/ML IJ SOLN
40.0000 mg | Freq: Once | INTRAMUSCULAR | Status: AC
  Administered 2017-04-17: 40 mg via INTRAVENOUS
  Filled 2017-04-17: qty 4

## 2017-04-17 NOTE — Social Work (Addendum)
CSW called pt wife, she is hard of hearing and could not recall where pt lives only that he "lives in self living in [place]." Pt wife states that pt son and daughter in law are in the [place] right now.   CSW called Estill Bamberg, admissions liaison at the [facility] and they stated that pt is not a resident there but suggested [place] call independent living [facility]. Baltazar Najjar, admissions liaison at [facility] states that pt is a resident there and at baseline fully independent.   CSW followed up with Estill Bamberg of the [facility] and she is amenable to taking pt for SNF prior to him returning to [place].   3:30pm- CSW left information regarding the [facility] SN[facility] and SNF packet at bedside. Pt stated no further questions and said he would look over packets.   CSW continuing to follow to support discharge when medically appropriate.  [place], [place] Work [phone]

## 2017-04-17 NOTE — Progress Notes (Signed)
Pharmacist Heart Failure Core Measure Documentation  Assessment: [name] has an EF documented as 20-25% on 11/13/12 by Echo.  Rationale: Heart failure patients with left ventricular systolic dysfunction (LVSD) and an EF < 40% should be prescribed an angiotensin converting enzyme inhibitor (ACEI) or angiotensin receptor blocker (ARB) at discharge unless a contraindication is documented in the medical record.  This patient is not currently on an ACEI or ARB for HF.  This note is being placed in the record in order to provide documentation that a contraindication to the use of these agents is present for this encounter.  ACE Inhibitor or Angiotensin Receptor Blocker is contraindicated (specify all that apply)  []   ACEI allergy AND ARB allergy []   Angioedema []   Moderate or severe aortic stenosis []   Hyperkalemia []   Hypotension []   Renal artery stenosis [x]   Worsening renal function, preexisting renal disease or dysfunction  Hildred Laser, Pharm D 04/17/2017 12:57 PM

## 2017-04-17 NOTE — Clinical Social Work Note (Signed)
Clinical Social Work Assessment  Patient Details  Name: [name] MRN: [id number] Date of Birth: 09-09-1940  Date of referral:  04/17/17               Reason for consult:  Facility Placement, Discharge Planning                Permission sought to share information with:  Facility Sport and exercise psychologist, Family Supports Permission granted to share information::  Yes, Verbal Permission Granted  Name::     [name]  Agency::     Relationship::  wife  Contact Information:  [phone]  Housing/Transportation Living arrangements for the past 2 months:  Apartment Source of Information:  Patient Patient Interpreter Needed:  None Criminal Activity/Legal Involvement Pertinent to Current Situation/Hospitalization:  No - Comment as needed Significant Relationships:  Adult Children, Spouse Lives with:  Self Do you feel safe going back to the place where you live?  Yes Need for family participation in patient care:  Yes (Comment)  Care giving concerns:  Pt states he lives alone in an apartment in [place], his wife lives with his son and daughter in law and has an in home aide. Pt requires support for mobility and would benefit from continued therapies at a SNF recommended level.    Social Worker assessment / plan:CSW met with pt in room, pt is oriented x4 and explained to [place] that he used to live with his wife in [place] but currently lives independently in an apartment in [place]. Pt's wife [name] is "incapacitated and lives with my son and his wife." CSW explained SNF recommendation and pt stated understanding. Pt says that he is accepting of SNF placement. Pt unable to recall name of apartments where he lives, but requested CSW call wife and explain recommendations as well. Pt stated adamantly that CSW not call son. CSW continuing to follow to support discharge when medically appropriate.  Employment status:  Retired Forensic scientist:  Medicare PT Recommendations:   [place] / Referral to community resources:  [place]  Patient/Family's Response to care:  Pt understands CSW role and SNF recommendation, would like SNF placement in [place] to be closer to wife.  Patient/Family's Understanding of and Emotional Response to Diagnosis, Current Treatment, and Prognosis:  Pt states understanding of diagnosis, current treatment and prognosis. Pt expressed some strong emotion in regards to CSW not talking to pt son, and calling wife instead.   Emotional Assessment Appearance:  Disheveled, Appears stated age Attitude/Demeanor/Rapport:  Inconsistent Affect (typically observed):  Other, Flat, Irritable(Inconsistent) Orientation:  Oriented to Self, Oriented to Place, Oriented to  Time, Oriented to Situation Alcohol / Substance use:  Not Applicable Psych involvement (Current and /or in the community):  No (Comment)  Discharge Needs  Concerns to be addressed:  Discharge Planning Concerns, Care Coordination, Decision making concerns Readmission within the last 30 days:  No Current discharge risk:  Physical Impairment, Lives alone Barriers to Discharge:  Continued Medical Work up   [employer], [place] 04/17/2017, 10:55 AM

## 2017-04-17 NOTE — Progress Notes (Signed)
PROGRESS NOTE    [name]  JSE:[id number] DOB: 28-Dec-1940 DOA: 04/15/2017 PCP: Octavio Graves, DO   Brief Narrative: [name] is a 77 y.o. 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 Patient presented with hypotension and encephalopathy. He was initially transferred to the ICU for vasopressor support and was quickly weaned off. He was found to have community acquired pneumonia and is on ceftriaxone/azithromycin for treatment. He was transferred out of the ICU on 1/25. PT/OT evals pending. Cultures pending.   Assessment & Plan:   Active Problems:   Septic shock (HCC)   RLL pneumonia ([place])   Septic shock Present on admission. Secondary to RLL pneumonia. Possibly some iatrogenic component. Required Levophed for a few hours in the ICU and weaned off successfully. Not intubated. Transferred out of the ICU on 1/25. -Continue treatment of pneumonia  RLL community acquired pneumonia Improving.On room air. Blood culture (1/24) pending. RVP negative. Sputum culture gram stain significant for rare gram positive cocci. Strep pneumo antigen negative. -Continue Ceftriaxone and azithromycin -PT/OT eval -Repeat imaging in 3-4 weeks to ensure no underlying mass  Acute on chronic systolic heart failure Last EF of 25-30% with apical/diffuse hypokinesis from 2018 echocardiogram. Patient is s/p ICD. Symptoms of dyspnea today. -Continue torsemide -Lasix 40 mg IV x1 -Chest x-ray  Essential hypertension Patient presented with hypotension. Holding antihypertensives (Coreg, Lisinopril, Metolazone, Imdur, ramipril)  Diabetes mellitus, insulin dependent Fairly controlled. -Continue Lantus and SSI  CAD Atherosclerotic disease seen on recent CT. -Continue Lipitor  Pulmonary artery  hypertension Moderate-severe. -outpatient follow-up  History of DVTs/PE -Continue Coumadin  History of CVA -Continue Coumadin  Hyperbilirubinemia Unknown etiology. Both direct/indirect elevated. CT scan from OSH was negative for liver pathology. No elevated transaminases. Alkaline phosphatase elevated but patient has no gallbladder.   DVT prophylaxis: Lovenox/Warfarin Code Status: Full code Family Communication: None at bedside Disposition Plan: Discharge to SNF   Consultants:   PCCM  Procedures:   None  Antimicrobials:  Vancomycin  Cefepime  Azithromycin  Ceftriaxone    Subjective: Dyspnea with right lower chest pain.  Objective: Vitals:   04/16/17 1518 04/16/17 1523 04/16/17 2122 04/17/17 0632  BP:   (!) 101/55 (!) 105/50  Pulse:   90 76  Resp:   20 20  Temp:   98.2 F (36.8 C) 98 F (36.7 C)  TempSrc:   Oral Oral  SpO2: 94% (!) 87% 94% 95%  Weight:    79.2 kg (174 lb 8 oz)  Height:        Intake/Output Summary (Last 24 hours) at 04/17/2017 1058 Last data filed at 04/17/2017 0609 Gross per 24 hour  Intake 170 ml  Output 1050 ml  Net -880 ml   Filed Weights   04/16/17 0650 04/16/17 0653 04/17/17 0737  Weight: 80.7 kg (177 lb 14.6 oz) 80.2 kg (176 lb 11.2 oz) 79.2 kg (174 lb 8 oz)    Examination:  General exam: Appears calm and comfortable Respiratory system: Diminished bilaterally with rales. especially decreased in RLL. Takes frequent breaks when speaking. Cardiovascular system: S1 & S2 heard, RRR. Gastrointestinal system: Abdomen is nondistended, soft and nontender. No organomegaly or masses felt. Normal bowel sounds heard. Central nervous system: Alert and oriented. No focal neurological deficits. Extremities: No edema. No calf tenderness Skin: No cyanosis. No rashes Psychiatry: Judgement and insight appear normal. Mood &  affect appropriate.     Data Reviewed: I have personally reviewed following labs and imaging  studies  CBC: Recent Labs  Lab 04/15/17 0408 04/16/17 0605 04/17/17 0532  WBC 15.1* 11.0* 14.3*  NEUTROABS 11.8*  --   --   HGB 15.5 12.7* 13.3  HCT 45.4 37.4* 38.6*  MCV 94.6 93.5 93.2  PLT 125* 96* 564*   Basic Metabolic Panel: Recent Labs  Lab 04/15/17 0408 04/16/17 0605  NA 136 134*  K 4.3 3.7  CL 97* 98*  CO2 24 23  GLUCOSE 161* 158*  BUN 63* 72*  CREATININE 2.68* 2.71*  CALCIUM 8.6* 8.4*  MG 2.2  --   PHOS 3.5  --    GFR: Estimated Creatinine Clearance: 21.3 mL/min (A) (by C-G formula based on SCr of 2.71 mg/dL (H)). Liver Function Tests: Recent Labs  Lab 04/15/17 0408 04/16/17 0605  AST 37 27  ALT 22 20  ALKPHOS 129* 141*  BILITOT 4.5* 3.0*  3.2*  PROT 7.5 6.7  ALBUMIN 3.1* 2.9*   No results for input(s): LIPASE, AMYLASE in the last 168 hours. No results for input(s): AMMONIA in the last 168 hours. Coagulation Profile: Recent Labs  Lab 04/15/17 0408 04/16/17 0605 04/17/17 0532  INR 1.15 1.38 1.51   Cardiac Enzymes: Recent Labs  Lab 04/15/17 0822  TROPONINI 0.14*   BNP (last 3 results) No results for input(s): PROBNP in the last 8760 hours. HbA1C: No results for input(s): HGBA1C in the last 72 hours. CBG: Recent Labs  Lab 04/16/17 0747 04/16/17 1203 04/16/17 1628 04/16/17 2124 04/17/17 0739  GLUCAP 124* 232* 215* 176* 180*   Lipid Profile: No results for input(s): CHOL, HDL, LDLCALC, TRIG, CHOLHDL, LDLDIRECT in the last 72 hours. Thyroid Function Tests: No results for input(s): TSH, T4TOTAL, FREET4, T3FREE, THYROIDAB in the last 72 hours. Anemia Panel: No results for input(s): VITAMINB12, FOLATE, FERRITIN, TIBC, IRON, RETICCTPCT in the last 72 hours. Sepsis Labs: Recent Labs  Lab 04/15/17 0408  PROCALCITON 0.71  LATICACIDVEN 1.3    Recent Results (from the past 240 hour(s))  Respiratory Panel by PCR     Status: None   Collection Time: 04/15/17  3:47 AM  Result Value Ref Range Status   Adenovirus NOT DETECTED NOT  DETECTED Final   Coronavirus 229E NOT DETECTED NOT DETECTED Final   Coronavirus HKU1 NOT DETECTED NOT DETECTED Final   Coronavirus NL63 NOT DETECTED NOT DETECTED Final   Coronavirus OC43 NOT DETECTED NOT DETECTED Final   Metapneumovirus NOT DETECTED NOT DETECTED Final   Rhinovirus / Enterovirus NOT DETECTED NOT DETECTED Final   Influenza A NOT DETECTED NOT DETECTED Final   Influenza B NOT DETECTED NOT DETECTED Final   Parainfluenza Virus 1 NOT DETECTED NOT DETECTED Final   Parainfluenza Virus 2 NOT DETECTED NOT DETECTED Final   Parainfluenza Virus 3 NOT DETECTED NOT DETECTED Final   Parainfluenza Virus 4 NOT DETECTED NOT DETECTED Final   Respiratory Syncytial Virus NOT DETECTED NOT DETECTED Final   Bordetella pertussis NOT DETECTED NOT DETECTED Final   Chlamydophila pneumoniae NOT DETECTED NOT DETECTED Final   Mycoplasma pneumoniae NOT DETECTED NOT DETECTED Final  MRSA PCR Screening     Status: None   Collection Time: 04/15/17  3:47 AM  Result Value Ref Range Status   MRSA by PCR NEGATIVE NEGATIVE Final    Comment:        The GeneXpert MRSA Assay (FDA approved for NASAL specimens only), is one component of a comprehensive MRSA colonization  surveillance program. It is not intended to diagnose MRSA infection nor to guide or monitor treatment for MRSA infections.   Culture, blood (routine x 2)     Status: None (Preliminary result)   Collection Time: 04/15/17  4:44 AM  Result Value Ref Range Status   Specimen Description BLOOD RIGHT HAND  Final   Special Requests IN PEDIATRIC BOTTLE Blood Culture adequate volume  Final   Culture NO GROWTH 1 DAY  Final   Report Status PENDING  Incomplete  Culture, blood (routine x 2)     Status: None (Preliminary result)   Collection Time: 04/15/17  4:46 AM  Result Value Ref Range Status   Specimen Description BLOOD RIGHT ARM  Final   Special Requests IN PEDIATRIC BOTTLE Blood Culture adequate volume  Final   Culture NO GROWTH 1 DAY  Final    Report Status PENDING  Incomplete  Culture, expectorated sputum-assessment     Status: None   Collection Time: 04/15/17  9:10 AM  Result Value Ref Range Status   Specimen Description Expect. Sput  Final   Special Requests NONE  Final   Sputum evaluation THIS SPECIMEN IS ACCEPTABLE FOR SPUTUM CULTURE  Final   Report Status 04/15/2017 FINAL  Final  Culture, respiratory (NON-Expectorated)     Status: None (Preliminary result)   Collection Time: 04/15/17  9:10 AM  Result Value Ref Range Status   Specimen Description Expect. Sput  Final   Special Requests NONE Reflexed from [id number]  Final   Gram Stain   Final    RARE WBC PRESENT,BOTH PMN AND MONONUCLEAR RARE GRAM POSITIVE COCCI    Culture FEW Consistent with normal respiratory flora.  Final   Report Status PENDING  Incomplete         Radiology Studies: Dg Chest Port 1 View  Result Date: 04/16/2017 CLINICAL DATA:  Pneumonia. EXAM: PORTABLE CHEST 1 VIEW COMPARISON:  04/15/2017.  02/24/2017. FINDINGS: AICD noted in stable position. Prior CABG. Cardiomegaly with bibasilar interstitial prominence again noted without interim change. No pleural effusion or pneumothorax. IMPRESSION: AICD noted in stable position. Prior CABG. Cardiomegaly with mild bilateral bibasilar interstitial prominence again noted without interim change from prior exam. Electronically Signed   By: Inez   On: 04/16/2017 08:49   Dg Chest Port 1 View  Result Date: 04/15/2017 CLINICAL DATA:  Chest pain EXAM: PORTABLE CHEST 1 VIEW COMPARISON:  04/15/2017 FINDINGS: 1148 hours. The cardio pericardial silhouette is enlarged. Interstitial markings are diffusely coarsened with chronic features. Similar appearance of the patchy bibasilar airspace opacity, right greater than left. Permanent pacer/AICD again noted. The visualized bony structures of the thorax are intact. Telemetry leads overlie the chest. IMPRESSION: 1. Stable exam with cardiomegaly and diffuse chronic  interstitial changes. 2. Patchy basilar airspace disease bilaterally, right greater than left is similar to prior study. Electronically Signed   By: Misty Stanley M.D.   On: 04/15/2017 12:07        Scheduled Meds: . atorvastatin  40 mg Oral Daily  . azithromycin  500 mg Oral Daily  . enoxaparin (LOVENOX) injection  1 mg/kg Subcutaneous Q24H  . insulin aspart  0-15 Units Subcutaneous TID WC  . insulin aspart  0-5 Units Subcutaneous QHS  . insulin glargine  10 Units Subcutaneous QHS  . levETIRAcetam  500 mg Oral BID  . mouth rinse  15 mL Mouth Rinse BID  . senna-docusate  1 tablet Oral BID  . torsemide  50 mg Oral BID  . Warfarin -  Pharmacist Dosing Inpatient   Does not apply q1800   Continuous Infusions: . sodium chloride    . cefTRIAXone (ROCEPHIN)  IV Stopped (04/16/17 2237)     LOS: 2 days     Cordelia Poche, MD [facility] 04/17/2017, 10:58 AM Pager: [phone]  If 7PM-7AM, please contact night-coverage [email or web address] Password [facility] 04/17/2017, 10:58 AM

## 2017-04-17 NOTE — Evaluation (Signed)
Occupational Therapy Evaluation Patient Details Name: ABDURAHMAN RUGG MRN: [id number] DOB: [birth year] Today's Date: 04/17/2017    History of Present Illness pt is a 77 y/o male with pmh significant for CKD, ICM, CAD post CABG, multiple CVA's, recurrent DVT/PE's, OSA and AAA repair admitted as a lateral transfer from [facility] due to s/s of flu then development of PNA.   Clinical Impression   Pt with decline in function and safety with ADLs and ADL mobility with decreased strength, balance and endurance. Pt limited by fatigue and required increased time and multiple rest breaks to complete tasks. Pt would benefit from acute OT services to address impairments to maximize level of function and safety    Follow Up Recommendations  SNF    Equipment Recommendations  None recommended by OT;Other (comment)(TBD at next venue of care)    Recommendations for Other Services       Precautions / Restrictions Precautions Precautions: Fall Restrictions Weight Bearing Restrictions: No      Mobility Bed Mobility Overal bed mobility: Needs Assistance Bed Mobility: Sit to Supine;Supine to Sit     Supine to sit: Mod assist Sit to supine: Mod assist   General bed mobility comments: assist to elevate trunk to sit EOB and with LEs back onto bed  Transfers Overall transfer level: Needs assistance Equipment used: Rolling walker (2 wheeled) Transfers: Sit to/from Stand Sit to Stand: Max assist         General transfer comment: required momentum to power up and attempted x 2 before completely standin. Cues for correct hand placement    Balance Overall balance assessment: Needs assistance Sitting-balance support: Feet supported Sitting balance-Leahy Scale: Poor     Standing balance support: During functional activity Standing balance-Leahy Scale: Poor                             ADL either performed or assessed with clinical judgement   ADL Overall ADL's : Needs  assistance/impaired Eating/Feeding: Supervision/ safety;Sitting Eating/Feeding Details (indicate cue type and reason): Poor balance and endurance Grooming: Wash/dry hands;Wash/dry face;Sitting;Moderate assistance Grooming Details (indicate cue type and reason): Poor balance and endurance Upper Body Bathing: Moderate assistance;Sitting Upper Body Bathing Details (indicate cue type and reason): Poor balance and endurance Lower Body Bathing: Maximal assistance   Upper Body Dressing : Sitting;Moderate assistance   Lower Body Dressing: Total assistance   Toilet Transfer: Maximal assistance;Stand-pivot;Cueing for safety;Cueing for sequencing;RW   Toileting- Clothing Manipulation and Hygiene: Total assistance       Functional mobility during ADLs: Maximal assistance;Cueing for sequencing;Cueing for safety General ADL Comments: Poor balance and endurance     Vision Baseline Vision/History: Wears glasses Wears Glasses: Reading only Patient Visual Report: No change from baseline       Perception     Praxis      Pertinent Vitals/Pain Pain Assessment: No/denies pain Faces Pain Scale: No hurt Pain Intervention(s): Monitored during session     Hand Dominance Right   Extremity/Trunk Assessment Upper Extremity Assessment Upper Extremity Assessment: Generalized weakness   Lower Extremity Assessment Lower Extremity Assessment: Defer to PT evaluation       Communication Communication Communication: No difficulties   Cognition Arousal/Alertness: Awake/alert Behavior During Therapy: WFL for tasks assessed/performed Overall Cognitive Status: No family/caregiver present to determine baseline cognitive functioning  General Comments       Exercises     Shoulder Instructions      Home Living Family/patient expects to be discharged to:: Skilled nursing facility Living Arrangements: Alone Available Help at Discharge: Other  (Comment)(none per pt) Type of Home: Apartment Home Access: Stairs to enter     Home Layout: One level     Bathroom Shower/Tub: Occupational psychologist: Standard     Home Equipment: Environmental consultant - 2 wheels          Prior Functioning/Environment Level of Independence: Independent with assistive device(s)        Comments: used RW        OT Problem List: Decreased strength;Decreased activity tolerance;Decreased cognition;Decreased knowledge of use of DME or AE;Decreased safety awareness;Decreased coordination;Impaired balance (sitting and/or standing)      OT Treatment/Interventions: Self-care/ADL training;DME and/or AE instruction;Therapeutic activities;Patient/family education;Therapeutic exercise    OT Goals(Current goals can be found in the care plan section) Acute Rehab OT Goals Patient Stated Goal: get well and go home OT Goal Formulation: With patient Time For Goal Achievement: 05/01/17 Potential to Achieve Goals: Good ADL Goals Pt Will Perform Grooming: with min guard assist;sitting Pt Will Perform Upper Body Bathing: with min assist;sitting Pt Will Perform Lower Body Bathing: sitting/lateral leans;with mod assist Pt Will Perform Upper Body Dressing: with min assist;sitting Pt Will Transfer to Toilet: with mod assist;stand pivot transfer;bedside commode Additional ADL Goal #1: Pt will compete bed mobility with min A to sit EOB to participate in grooming and ADL tasks  OT Frequency: Min 2X/week   Barriers to D/C: Decreased caregiver support          Co-evaluation              AM-PAC PT "6 Clicks" Daily Activity     Outcome Measure Help from another person eating meals?: A Little Help from another person taking care of personal grooming?: A Little Help from another person toileting, which includes using toliet, bedpan, or urinal?: Total Help from another person bathing (including washing, rinsing, drying)?: A Lot Help from another person to put  on and taking off regular upper body clothing?: A Lot Help from another person to put on and taking off regular lower body clothing?: Total 6 Click Score: 12   End of Session Equipment Utilized During Treatment: Gait belt;Rolling walker;Other (comment)(BSC)  Activity Tolerance: Patient limited by fatigue Patient left: in bed;with call bell/phone within reach  OT Visit Diagnosis: Unsteadiness on feet (R26.81);Other abnormalities of gait and mobility (R26.89);Muscle weakness (generalized) (M62.81)                Time: 8546-2703 OT Time Calculation (min): 26 min Charges:  OT General Charges $OT Visit: 1 Visit OT Evaluation $OT Eval Moderate Complexity: 1 Mod OT Treatments $Therapeutic Activity: 8-22 mins G-Codes: OT G-codes **NOT FOR INPATIENT CLASS** Functional Assessment Tool Used: AM-PAC 6 Clicks Daily Activity    Britt Bottom 04/17/2017, 2:51 PM

## 2017-04-17 NOTE — Progress Notes (Signed)
[place] for warfarin + enoxaparin Indication: hx DVT/PEs  Allergies  Allergen Reactions  . Aspirin Anaphylaxis  . Ivp Dye [Iodinated Diagnostic Agents] Anaphylaxis    Blackout  . Heparin Other (See Comments)    Patient does not know what problem was, it occurred at [facility], but this is not in [place] Patient does not know what problem was, it occurred at [facility], but this is not in [place]     Patient Measurements: Height: 5\' 7"  (170.2 cm) Weight: 174 lb 8 oz (79.2 kg) IBW/kg (Calculated) : 66.1 Heparin Dosing Weight: 77.5 kg  Vital Signs: Temp: 98 F (36.7 C) (01/26 0632) Temp Source: Oral (01/26 1962) BP: 105/50 (01/26 2297) Pulse Rate: 76 (01/26 0632)  Labs: Recent Labs    04/15/17 0408 04/15/17 9892 04/16/17 0605 04/17/17 0532  HGB 15.5  --  12.7* 13.3  HCT 45.4  --  37.4* 38.6*  PLT 125*  --  96* 118*  LABPROT 14.6  --  16.8* 18.1*  INR 1.15  --  1.38 1.51  CREATININE 2.68*  --  2.71*  --   TROPONINI  --  0.14*  --   --     Estimated Creatinine Clearance: 21.3 mL/min (A) (by C-G formula based on SCr of 2.71 mg/dL (H)).   Medical History: Past Medical History:  Diagnosis Date  . AICD (automatic cardioverter/defibrillator) present   . CHF (congestive heart failure) ([place])   . Chronic kidney disease   . COPD (chronic obstructive pulmonary disease) ([place])   . Coronary artery disease   . CVA (cerebral vascular accident) ([place])    6 . no residual  . Diabetes mellitus    TYpe II  . DVT (deep venous thrombosis) ([place])   . GERD (gastroesophageal reflux disease)   . Gout   . History of blood transfusion   . Hyperlipemia   . Hypertension   . Presence of cardiac defibrillator   . Presence of permanent cardiac pacemaker   . PVD (peripheral vascular disease) ([place])   . Shortness of breath dyspnea    with exertion  . Sleep apnea   . TIA (transient ischemic attack)      Medications:  Scheduled:  . atorvastatin  40 mg Oral Daily  . azithromycin  500 mg Oral Daily  . enoxaparin (LOVENOX) injection  1 mg/kg Subcutaneous Q24H  . insulin aspart  0-15 Units Subcutaneous TID WC  . insulin aspart  0-5 Units Subcutaneous QHS  . insulin glargine  10 Units Subcutaneous QHS  . levETIRAcetam  500 mg Oral BID  . mouth rinse  15 mL Mouth Rinse BID  . senna-docusate  1 tablet Oral BID  . torsemide  50 mg Oral BID  . Warfarin - Pharmacist Dosing Inpatient   Does not apply q1800    Assessment: 84 yom transferred from OSH with concerns for sepsis. Was presenting with confusion, weakness, and rigors; hypotension and fever noted at OSH. On warfarin PTA for history of multiple DVTs and PEs.  - INR= 1.51 (minimal rise)  Home regimen is 5 mg and 2.5 mg alternating, confirmed with patient.   Goal of Therapy:  INR 2-3 Monitor platelets by anticoagulation protocol: Yes   Plan:  Coumadin 7.5mg  po today Enoxaparin 80 mg once daily until INR 1.8 Monitor daily INR   Hildred Laser, Pharm D 04/17/2017 11:13 AM

## 2017-04-17 NOTE — NC FL2 (Signed)
[place] LEVEL OF CARE SCREENING TOOL     IDENTIFICATION  Patient Name: [name] Birthdate: Feb 01, 1941 Sex: male Admission Date (Current Location): 04/15/2017  [facility] and [place] Number:  [employer] and Address:  [facility] [place] [address] [place], [place] [address]      Provider Number: [id number]  Attending Physician Name and Address:  Mariel Aloe, MD  Relative Name and Phone Number:       Current Level of Care: Hospital Recommended Level of Care: [place] Prior Approval Number:    Date Approved/Denied:   PASRR Number: [id number]  Discharge Plan: SNF    Current Diagnoses: Patient Active Problem List   Diagnosis Date Noted  . RLL pneumonia ([place]) 04/16/2017  . Septic shock ([place]) 04/15/2017  . Pleuritic chest pain 11/13/2012  . SOB (shortness of breath) 11/13/2012  . Warfarin-induced coagulopathy ([place]) 11/13/2012  . Chest pain 11/13/2012  . Acute on chronic systolic CHF (congestive heart failure) ([place]) 11/13/2012  . COPD (chronic obstructive pulmonary disease) ([place])   . Hypertension   . PVD (peripheral vascular disease) (HCC)     Orientation RESPIRATION BLADDER Height & Weight     Self, Time, Situation, Place  Normal Continent Weight: 174 lb 8 oz (79.2 kg) Height:  5\' 7"  (170.2 cm)  BEHAVIORAL SYMPTOMS/MOOD NEUROLOGICAL BOWEL NUTRITION STATUS      Continent Diet(see discharge summary)  AMBULATORY STATUS COMMUNICATION OF NEEDS Skin   Extensive Assist Verbally Skin abrasions(scratches on back)                       Personal Care Assistance Level of Assistance  Bathing, Feeding, Dressing Bathing Assistance: Maximum assistance Feeding assistance: Independent Dressing Assistance: Maximum assistance     Functional Limitations Info  Sight, Hearing, Speech Sight Info: Adequate(wears glasses) Hearing Info: Adequate Speech Info: Adequate    SPECIAL CARE FACTORS FREQUENCY   OT (By licensed OT), PT (By licensed PT)     PT Frequency: 5x week OT Frequency: 5x week            Contractures Contractures Info: Not present    Additional Factors Info  Code Status, Allergies Code Status Info: Full Code Allergies Info: ASPIRIN, IVP DYE IODINATED DIAGNOSTIC AGENTS, HEPARIN            Current Medications (04/17/2017):  This is the current hospital active medication list Current Facility-Administered Medications  Medication Dose Route Frequency Provider Last Rate Last Dose  . 0.9 %  sodium chloride infusion  250 mL Intravenous PRN Jennelle Human B, NP      . atorvastatin (LIPITOR) tablet 40 mg  40 mg Oral Daily Mariel Aloe, MD   40 mg at 04/17/17 0858  . azithromycin (ZITHROMAX) tablet 500 mg  500 mg Oral Daily Mariel Aloe, MD   500 mg at 04/17/17 0858  . cefTRIAXone (ROCEPHIN) 1 g in dextrose 5 % 50 mL IVPB  1 g Intravenous Q24H Erick Colace, NP   Stopped at 04/16/17 2237  . enoxaparin (LOVENOX) injection 80 mg  1 mg/kg Subcutaneous Q24H Hammonds, Sharyn Blitz, MD   80 mg at 04/16/17 1126  . insulin aspart (novoLOG) injection 0-15 Units  0-15 Units Subcutaneous TID WC Mauri Brooklyn, MD   1 Units at 04/17/17 [phone]  . insulin aspart (novoLOG) injection 0-5 Units  0-5 Units Subcutaneous QHS Mauri Brooklyn, MD   4 Units at 04/15/17 2055  . insulin  glargine (LANTUS) injection 10 Units  10 Units Subcutaneous QHS Mauri Brooklyn, MD   10 Units at 04/16/17 2207  . levETIRAcetam (KEPPRA) tablet 500 mg  500 mg Oral BID Jennelle Human B, NP   500 mg at 04/17/17 0857  . MEDLINE mouth rinse  15 mL Mouth Rinse BID Hammonds, Sharyn Blitz, MD   15 mL at 04/17/17 0859  . senna-docusate (Senokot-S) tablet 1 tablet  1 tablet Oral BID Erick Colace, NP   1 tablet at 04/17/17 0857  . torsemide (DEMADEX) tablet 50 mg  50 mg Oral BID Mariel Aloe, MD   50 mg at 04/17/17 0857  . Warfarin - Pharmacist Dosing Inpatient   Does not apply q1800 Hammonds, Sharyn Blitz, MD          Discharge Medications: Please see discharge summary for a list of discharge medications.  Relevant Imaging Results:  Relevant Lab Results:   Additional Information SS# [place], [place]

## 2017-04-18 LAB — PROTIME-INR
INR: 1.76
PROTHROMBIN TIME: 20.3 s — AB (ref 11.4–15.2)

## 2017-04-18 LAB — CBC
HCT: 38.6 % — ABNORMAL LOW (ref 39.0–52.0)
Hemoglobin: 13.1 g/dL (ref 13.0–17.0)
MCH: 31.8 pg (ref 26.0–34.0)
MCHC: 33.9 g/dL (ref 30.0–36.0)
MCV: 93.7 fL (ref 78.0–100.0)
PLATELETS: 131 10*3/uL — AB (ref 150–400)
RBC: 4.12 MIL/uL — AB (ref 4.22–5.81)
RDW: 14.5 % (ref 11.5–15.5)
WBC: 11.9 10*3/uL — AB (ref 4.0–10.5)

## 2017-04-18 LAB — GLUCOSE, CAPILLARY
GLUCOSE-CAPILLARY: 206 mg/dL — AB (ref 65–99)
GLUCOSE-CAPILLARY: 190 mg/dL — AB (ref 65–99)
GLUCOSE-CAPILLARY: 219 mg/dL — AB (ref 65–99)
GLUCOSE-CAPILLARY: 157 mg/dL — AB (ref 65–99)

## 2017-04-18 LAB — LEVETIRACETAM LEVEL: LEVETIRACETAM: 23.5 ug/mL (ref 10.0–40.0)

## 2017-04-18 MED ORDER — FUROSEMIDE 10 MG/ML IJ SOLN
40.0000 mg | Freq: Once | INTRAMUSCULAR | Status: AC
  Administered 2017-04-18: 40 mg via INTRAVENOUS
  Filled 2017-04-18: qty 4

## 2017-04-18 MED ORDER — CARVEDILOL 3.125 MG PO TABS
3.1250 mg | ORAL_TABLET | Freq: Two times a day (BID) | ORAL | Status: DC
  Administered 2017-04-18 – 2017-04-21 (×6): 3.125 mg via ORAL
  Filled 2017-04-18 (×6): qty 1

## 2017-04-18 MED ORDER — INSULIN GLARGINE 100 UNIT/ML ~~LOC~~ SOLN
13.0000 [IU] | Freq: Every day | SUBCUTANEOUS | Status: DC
  Administered 2017-04-18 – 2017-04-19 (×2): 13 [IU] via SUBCUTANEOUS
  Filled 2017-04-18 (×2): qty 0.13

## 2017-04-18 MED ORDER — WARFARIN SODIUM 5 MG PO TABS
5.0000 mg | ORAL_TABLET | Freq: Once | ORAL | Status: AC
  Administered 2017-04-18: 5 mg via ORAL
  Filled 2017-04-18: qty 1

## 2017-04-18 NOTE — Plan of Care (Signed)
  Health Behavior/Discharge Planning: Ability to manage health-related needs will improve 04/18/2017 0041 - Progressing by Tristan Schroeder, RN   Education: Knowledge of General Education information will improve 04/18/2017 0041 - Progressing by Tristan Schroeder, RN   Clinical Measurements: Respiratory complications will improve 04/18/2017 0041 - Progressing by Tristan Schroeder, RN

## 2017-04-18 NOTE — Progress Notes (Signed)
Patient O2 stable on Room Air, but patient stated that he is having SOB. 2L O2 placed via nasal cannula for comfort. After few minutes later patient stated that he feels much better with oxygen and he would prefer to keep nasal canula on. O2 remains stable.  Will continue to monitor.  Jermiya Reichl, RN

## 2017-04-18 NOTE — Progress Notes (Signed)
[place] for warfarin + enoxaparin Indication: hx DVT/PEs  Allergies  Allergen Reactions  . Aspirin Anaphylaxis  . Ivp Dye [Iodinated Diagnostic Agents] Anaphylaxis    Blackout  . Heparin Other (See Comments)    Patient does not know what problem was, it occurred at [facility], but this is not in [place] Patient does not know what problem was, it occurred at [facility], but this is not in [place]     Patient Measurements: Height: 5\' 7"  (170.2 cm) Weight: 170 lb 12.8 oz (77.5 kg)(scale b) IBW/kg (Calculated) : 66.1 Heparin Dosing Weight: 77.5 kg  Vital Signs: Temp: 98 F (36.7 C) (01/27 0520) Temp Source: Oral (01/27 0520) BP: 108/74 (01/27 0520) Pulse Rate: 83 (01/27 0520)  Labs: Recent Labs    04/16/17 0605 04/17/17 0532 04/17/17 1121 04/18/17 0651  HGB 12.7* 13.3  --  13.1  HCT 37.4* 38.6*  --  38.6*  PLT 96* 118*  --  131*  LABPROT 16.8* 18.1*  --  20.3*  INR 1.38 1.51  --  1.76  CREATININE 2.71*  --  2.44*  --     Estimated Creatinine Clearance: 23.7 mL/min (A) (by C-G formula based on SCr of 2.44 mg/dL (H)).   Medical History: Past Medical History:  Diagnosis Date  . AICD (automatic cardioverter/defibrillator) present   . CHF (congestive heart failure) ([place])   . Chronic kidney disease   . COPD (chronic obstructive pulmonary disease) ([place])   . Coronary artery disease   . CVA (cerebral vascular accident) ([place])    6 . no residual  . Diabetes mellitus    TYpe II  . DVT (deep venous thrombosis) ([place])   . GERD (gastroesophageal reflux disease)   . Gout   . History of blood transfusion   . Hyperlipemia   . Hypertension   . Presence of cardiac defibrillator   . Presence of permanent cardiac pacemaker   . PVD (peripheral vascular disease) ([place])   . Shortness of breath dyspnea    with exertion  . Sleep apnea   . TIA (transient ischemic attack)     Medications:   Scheduled:  . atorvastatin  40 mg Oral Daily  . azithromycin  500 mg Oral Daily  . enoxaparin (LOVENOX) injection  1 mg/kg Subcutaneous Q24H  . furosemide  40 mg Intravenous Once  . insulin aspart  0-15 Units Subcutaneous TID WC  . insulin aspart  0-5 Units Subcutaneous QHS  . insulin glargine  10 Units Subcutaneous QHS  . levETIRAcetam  500 mg Oral BID  . mouth rinse  15 mL Mouth Rinse BID  . senna-docusate  1 tablet Oral BID  . torsemide  50 mg Oral BID  . Warfarin - Pharmacist Dosing Inpatient   Does not apply q1800    Assessment: 52 yom transferred from OSH with concerns for sepsis. Was presenting with confusion, weakness, and rigors; hypotension and fever noted at OSH. On warfarin PTA for history of multiple DVTs and PEs.  - INR= 1.76 (trend up)  Home regimen is 5 mg and 2.5 mg alternating, confirmed with patient.   Goal of Therapy:  INR 2-3 Monitor platelets by anticoagulation protocol: Yes   Plan:  Coumadin 5mg  po today Enoxaparin 80 mg once daily until INR 1.8 Monitor daily INR   Hildred Laser, Pharm D 04/18/2017 11:13 AM

## 2017-04-18 NOTE — Progress Notes (Signed)
Patient was on room air throughout the night O2 sats WNL, but patient said he felt "winded this morning", so RN placed on 2L O2 via nasal cannula.

## 2017-04-18 NOTE — Progress Notes (Signed)
PROGRESS NOTE    FIELD STANISZEWSKI  LFY:[id number] DOB: 1940-12-30 DOA: 04/15/2017 PCP: Octavio Graves, DO   Brief Narrative: [name] is a 77 y.o. 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 Patient presented with hypotension and encephalopathy. He was initially transferred to the ICU for vasopressor support and was quickly weaned off. He was found to have community acquired pneumonia and is on ceftriaxone/azithromycin for treatment. He was transferred out of the ICU on 1/25. PT/OT evals pending. Cultures pending.   Assessment & Plan:   Active Problems:   Septic shock (HCC)   RLL pneumonia ([place])   Septic shock Present on admission. Secondary to RLL pneumonia. Possibly some iatrogenic component. Required Levophed for a few hours in the ICU and weaned off successfully. Not intubated. Transferred out of the ICU on 1/25. -Continue treatment of pneumonia  RLL community acquired pneumonia Improving.On room air. Blood culture (1/24) pending. RVP negative. Sputum culture gram stain significant for rare gram positive cocci. Strep pneumo antigen negative. -Continue Ceftriaxone and azithromycin -PT/OT eval: SNF -Repeat imaging in 3-4 weeks to ensure no underlying mass  Acute on chronic systolic heart failure Last EF of 25-30% with apical/diffuse hypokinesis from 2018 echocardiogram. Patient is s/p ICD. Symptoms of dyspnea today improved but still present. Weight back down to admission weight today at 170 lbs; UOP 925 mL in last 24 hours. -Continue torsemide -Another dose of Lasix 40 mg IV x1  Essential hypertension Patient presented with hypotension. Holding antihypertensives (Lisinopril, Metolazone, Imdur). Still with soft blood pressure. -restart Coreg for heart failure. Watch BP  Diabetes mellitus, insulin  dependent Poorly controlled. -Increase to Lantus 13 units and continue SSI  CAD Atherosclerotic disease seen on recent CT. -Continue Lipitor  Pulmonary artery hypertension Moderate-severe. -outpatient follow-up  History of DVTs/PE -Continue Coumadin  History of CVA -Continue Coumadin  Hyperbilirubinemia Unknown etiology. Both direct/indirect elevated. CT scan from OSH was negative for liver pathology. No elevated transaminases. Alkaline phosphatase elevated but patient has no gallbladder. Trending down.   DVT prophylaxis: Lovenox/Warfarin Code Status: Full code Family Communication: None at bedside Disposition Plan: Discharge to SNF   Consultants:   PCCM  Procedures:   None  Antimicrobials:  Vancomycin  Cefepime  Azithromycin  Ceftriaxone    Subjective: Dyspnea improved. Chest pain resolved.  Objective: Vitals:   04/17/17 0632 04/17/17 1300 04/17/17 1954 04/18/17 0520  BP: (!) 105/50 (!) 104/52 116/62 108/74  Pulse: 76 70 90 83  Resp: 20 20 18 18   Temp: 98 F (36.7 C) 98.3 F (36.8 C) 100 F (37.8 C) 98 F (36.7 C)  TempSrc: Oral Oral Oral Oral  SpO2: 95% 98% 98% 98%  Weight: 79.2 kg (174 lb 8 oz)   77.5 kg (170 lb 12.8 oz)  Height:        Intake/Output Summary (Last 24 hours) at 04/18/2017 1138 Last data filed at 04/18/2017 1102 Gross per 24 hour  Intake 1092 ml  Output 925 ml  Net 167 ml   Filed Weights   04/16/17 0653 04/17/17 0632 04/18/17 0520  Weight: 80.2 kg (176 lb 11.2 oz) 79.2 kg (174 lb 8 oz) 77.5 kg (170 lb 12.8 oz)    Examination:  General exam: Appears calm and comfortable Respiratory system: Diminished bilaterally with rales. Cardiovascular system: S1 & S2 heard, RRR. Gastrointestinal system: Abdomen is nondistended, soft and  nontender. No organomegaly or masses felt. Normal bowel sounds heard. Central nervous system: Alert and oriented. No focal neurological deficits. Extremities: No calf tenderness Skin: No  cyanosis. No rashes Psychiatry: Judgement and insight appear normal. Mood & affect appropriate.     Data Reviewed: I have personally reviewed following labs and imaging studies  CBC: Recent Labs  Lab 04/15/17 0408 04/16/17 0605 04/17/17 0532 04/18/17 0651  WBC 15.1* 11.0* 14.3* 11.9*  NEUTROABS 11.8*  --   --   --   HGB 15.5 12.7* 13.3 13.1  HCT 45.4 37.4* 38.6* 38.6*  MCV 94.6 93.5 93.2 93.7  PLT 125* 96* 118* 379*   Basic Metabolic Panel: Recent Labs  Lab 04/15/17 0408 04/16/17 0605 04/17/17 1121  NA 136 134* 132*  K 4.3 3.7 4.1  CL 97* 98* 96*  CO2 24 23 22   GLUCOSE 161* 158* 275*  BUN 63* 72* 66*  CREATININE 2.68* 2.71* 2.44*  CALCIUM 8.6* 8.4* 8.3*  MG 2.2  --   --   PHOS 3.5  --   --    GFR: Estimated Creatinine Clearance: 23.7 mL/min (A) (by C-G formula based on SCr of 2.44 mg/dL (H)). Liver Function Tests: Recent Labs  Lab 04/15/17 0408 04/16/17 0605  AST 37 27  ALT 22 20  ALKPHOS 129* 141*  BILITOT 4.5* 3.0*  3.2*  PROT 7.5 6.7  ALBUMIN 3.1* 2.9*   No results for input(s): LIPASE, AMYLASE in the last 168 hours. No results for input(s): AMMONIA in the last 168 hours. Coagulation Profile: Recent Labs  Lab 04/15/17 0408 04/16/17 0605 04/17/17 0532 04/18/17 0651  INR 1.15 1.38 1.51 1.76   Cardiac Enzymes: Recent Labs  Lab 04/15/17 0822  TROPONINI 0.14*   BNP (last 3 results) No results for input(s): PROBNP in the last 8760 hours. HbA1C: No results for input(s): HGBA1C in the last 72 hours. CBG: Recent Labs  Lab 04/17/17 0739 04/17/17 1057 04/17/17 1629 04/17/17 2113 04/18/17 0747  GLUCAP 180* 259* 232* 209* 206*   Lipid Profile: No results for input(s): CHOL, HDL, LDLCALC, TRIG, CHOLHDL, LDLDIRECT in the last 72 hours. Thyroid Function Tests: No results for input(s): TSH, T4TOTAL, FREET4, T3FREE, THYROIDAB in the last 72 hours. Anemia Panel: No results for input(s): VITAMINB12, FOLATE, FERRITIN, TIBC, IRON, RETICCTPCT in  the last 72 hours. Sepsis Labs: Recent Labs  Lab 04/15/17 0408  PROCALCITON 0.71  LATICACIDVEN 1.3    Recent Results (from the past 240 hour(s))  Respiratory Panel by PCR     Status: None   Collection Time: 04/15/17  3:47 AM  Result Value Ref Range Status   Adenovirus NOT DETECTED NOT DETECTED Final   Coronavirus 229E NOT DETECTED NOT DETECTED Final   Coronavirus HKU1 NOT DETECTED NOT DETECTED Final   Coronavirus NL63 NOT DETECTED NOT DETECTED Final   Coronavirus OC43 NOT DETECTED NOT DETECTED Final   Metapneumovirus NOT DETECTED NOT DETECTED Final   Rhinovirus / Enterovirus NOT DETECTED NOT DETECTED Final   Influenza A NOT DETECTED NOT DETECTED Final   Influenza B NOT DETECTED NOT DETECTED Final   Parainfluenza Virus 1 NOT DETECTED NOT DETECTED Final   Parainfluenza Virus 2 NOT DETECTED NOT DETECTED Final   Parainfluenza Virus 3 NOT DETECTED NOT DETECTED Final   Parainfluenza Virus 4 NOT DETECTED NOT DETECTED Final   Respiratory Syncytial Virus NOT DETECTED NOT DETECTED Final   Bordetella pertussis NOT DETECTED NOT DETECTED Final   Chlamydophila pneumoniae NOT DETECTED NOT DETECTED Final   Mycoplasma pneumoniae  NOT DETECTED NOT DETECTED Final  MRSA PCR Screening     Status: None   Collection Time: 04/15/17  3:47 AM  Result Value Ref Range Status   MRSA by PCR NEGATIVE NEGATIVE Final    Comment:        The GeneXpert MRSA Assay (FDA approved for NASAL specimens only), is one component of a comprehensive MRSA colonization surveillance program. It is not intended to diagnose MRSA infection nor to guide or monitor treatment for MRSA infections.   Culture, blood (routine x 2)     Status: None (Preliminary result)   Collection Time: 04/15/17  4:44 AM  Result Value Ref Range Status   Specimen Description BLOOD RIGHT HAND  Final   Special Requests IN PEDIATRIC BOTTLE Blood Culture adequate volume  Final   Culture NO GROWTH 2 DAYS  Final   Report Status PENDING  Incomplete    Culture, blood (routine x 2)     Status: None (Preliminary result)   Collection Time: 04/15/17  4:46 AM  Result Value Ref Range Status   Specimen Description BLOOD RIGHT ARM  Final   Special Requests IN PEDIATRIC BOTTLE Blood Culture adequate volume  Final   Culture NO GROWTH 2 DAYS  Final   Report Status PENDING  Incomplete  Culture, expectorated sputum-assessment     Status: None   Collection Time: 04/15/17  9:10 AM  Result Value Ref Range Status   Specimen Description Expect. Sput  Final   Special Requests NONE  Final   Sputum evaluation THIS SPECIMEN IS ACCEPTABLE FOR SPUTUM CULTURE  Final   Report Status 04/15/2017 FINAL  Final  Culture, respiratory (NON-Expectorated)     Status: None   Collection Time: 04/15/17  9:10 AM  Result Value Ref Range Status   Specimen Description Expect. Sput  Final   Special Requests NONE Reflexed from [id number]  Final   Gram Stain   Final    RARE WBC PRESENT,BOTH PMN AND MONONUCLEAR RARE GRAM POSITIVE COCCI    Culture FEW Consistent with normal respiratory flora.  Final   Report Status 04/17/2017 FINAL  Final         Radiology Studies: Dg Chest Port 1 View  Result Date: 04/17/2017 CLINICAL DATA:  Rales and dyspnea.  Being treated for pneumonia. EXAM: PORTABLE CHEST 1 VIEW COMPARISON:  Yesterday FINDINGS: Biventricular ICD/pacer from the left in stable position. Chronic cardiomegaly. Status post CABG. There is asymmetric lung opacity on the right with probable small effusion. Left lung is comparatively clear. No generalized Kerley lines. No pneumothorax. IMPRESSION: Progressive right-sided pneumonia with probable small pleural effusion. Electronically Signed   By: Monte Fantasia M.D.   On: 04/17/2017 14:12        Scheduled Meds: . atorvastatin  40 mg Oral Daily  . azithromycin  500 mg Oral Daily  . enoxaparin (LOVENOX) injection  1 mg/kg Subcutaneous Q24H  . furosemide  40 mg Intravenous Once  . insulin aspart  0-15 Units Subcutaneous  TID WC  . insulin aspart  0-5 Units Subcutaneous QHS  . insulin glargine  10 Units Subcutaneous QHS  . levETIRAcetam  500 mg Oral BID  . mouth rinse  15 mL Mouth Rinse BID  . senna-docusate  1 tablet Oral BID  . torsemide  50 mg Oral BID  . warfarin  5 mg Oral ONCE-1800  . Warfarin - Pharmacist Dosing Inpatient   Does not apply q1800   Continuous Infusions: . sodium chloride    . cefTRIAXone (ROCEPHIN)  IV Stopped (04/17/17 2251)     LOS: 3 days     Cordelia Poche, MD [facility] 04/18/2017, 11:38 AM Pager: [phone]  If 7PM-7AM, please contact night-coverage [email or web address] Password [facility] 04/18/2017, 11:38 AM

## 2017-04-19 ENCOUNTER — Inpatient Hospital Stay (HOSPITAL_COMMUNITY): Payer: Medicare Other

## 2017-04-19 LAB — GLUCOSE, CAPILLARY
GLUCOSE-CAPILLARY: 217 mg/dL — AB (ref 65–99)
GLUCOSE-CAPILLARY: 224 mg/dL — AB (ref 65–99)
Glucose-Capillary: 250 mg/dL — ABNORMAL HIGH (ref 65–99)

## 2017-04-19 LAB — COMPREHENSIVE METABOLIC PANEL
ALBUMIN: 2.4 g/dL — AB (ref 3.5–5.0)
ALT: 21 U/L (ref 17–63)
AST: 38 U/L (ref 15–41)
Alkaline Phosphatase: 168 U/L — ABNORMAL HIGH (ref 38–126)
Anion gap: 15 (ref 5–15)
BILIRUBIN TOTAL: 1.6 mg/dL — AB (ref 0.3–1.2)
BUN: 62 mg/dL — AB (ref 6–20)
CHLORIDE: 100 mmol/L — AB (ref 101–111)
CO2: 21 mmol/L — ABNORMAL LOW (ref 22–32)
Calcium: 8 mg/dL — ABNORMAL LOW (ref 8.9–10.3)
Creatinine, Ser: 2 mg/dL — ABNORMAL HIGH (ref 0.61–1.24)
GFR calc Af Amer: 35 mL/min — ABNORMAL LOW (ref >60)
GFR, EST NON AFRICAN AMERICAN: 30 mL/min — AB (ref >60)
GLUCOSE: 250 mg/dL — AB (ref 65–99)
POTASSIUM: 3.4 mmol/L — AB (ref 3.5–5.1)
Sodium: 136 mmol/L (ref 135–145)
TOTAL PROTEIN: 7 g/dL (ref 6.5–8.1)

## 2017-04-19 LAB — PROTIME-INR
INR: 2.19
PROTHROMBIN TIME: 24.2 s — AB (ref 11.4–15.2)

## 2017-04-19 MED ORDER — VANCOMYCIN HCL IN DEXTROSE 1-5 GM/200ML-% IV SOLN
1000.0000 mg | Freq: Once | INTRAVENOUS | Status: AC
  Administered 2017-04-19: 1000 mg via INTRAVENOUS
  Filled 2017-04-19: qty 200

## 2017-04-19 MED ORDER — WARFARIN SODIUM 2.5 MG PO TABS
2.5000 mg | ORAL_TABLET | Freq: Once | ORAL | Status: AC
  Administered 2017-04-19: 2.5 mg via ORAL
  Filled 2017-04-19: qty 1

## 2017-04-19 MED ORDER — VANCOMYCIN HCL IN DEXTROSE 750-5 MG/150ML-% IV SOLN
750.0000 mg | INTRAVENOUS | Status: DC
  Administered 2017-04-20 – 2017-04-21 (×2): 750 mg via INTRAVENOUS
  Filled 2017-04-19 (×2): qty 150

## 2017-04-19 MED ORDER — CEFEPIME HCL 1 G IJ SOLR
1.0000 g | INTRAMUSCULAR | Status: DC
  Administered 2017-04-19 – 2017-04-21 (×3): 1 g via INTRAVENOUS
  Filled 2017-04-19 (×3): qty 1

## 2017-04-19 NOTE — Clinical Social Work Note (Signed)
CSW met with patient to discuss bed offers: [place]. Discussed how insurance covers SNF placement. Patient concerned this will affect his wife's coverage. CSW explained that it does not. Patient stated that he preferred to return home alone with home health although he admits he can hardly walk. Discussed concerns of home health coming in only 2-3 times per week if he lives home alone and cannot walk. Patient initially appears to be alert and oriented but throughout conversation appeared mildly confused. He noted that his wife lives with his son who will be back from vacation tomorrow. CSW told patient that CSW will follow up tomorrow and patient stated that his son does not have anything to do with him, his wife, or his daughter, although he just stated his wife lives with his son. Patient kept mentioning a farm his son and daughter-in-law live on and thought that if he went to a skilled nursing facility, no one would feed him. CSW has paged MD to potential psych eval for capacity.  Dayton Scrape, [place]

## 2017-04-19 NOTE — Progress Notes (Signed)
Physical Therapy Treatment Patient Details Name: [name] MRN: [id number] DOB: [birth year] Today's Date: 04/19/2017    History of Present Illness pt is a 77 y/o male with pmh significant for CKD, ICM, CAD post CABG, multiple CVA's, recurrent DVT/PE's, OSA and AAA repair admitted as a lateral transfer from [facility] due to s/s of flu then development of PNA.    PT Comments    Pt showing improved mobility, requiring less assist for bed mobilities and transfers during today's session. Pt requires mod A for bed mobilities and min A +2 to transfer safely. Pt with DOE after transfer and requesting a rest. SpO2 remained >90 throughout treatment. Plan to attempt ambulation in next session. Will continue to follow acutely to maximize pt's activity tolerance and functional independence.   Follow Up Recommendations  SNF;Supervision/Assistance - 24 hour     Equipment Recommendations  Other (comment)(TBA)    Recommendations for Other Services       Precautions / Restrictions Precautions Precautions: Fall Restrictions Weight Bearing Restrictions: No    Mobility  Bed Mobility Overal bed mobility: Needs Assistance Bed Mobility: Supine to Sit     Supine to sit: Mod assist     General bed mobility comments: assist to elevate trunk to sit EOB and progress LE off EOB  Transfers Overall transfer level: Needs assistance Equipment used: Rolling walker (2 wheeled) Transfers: Sit to/from [facility] Sit to Stand: Min assist;+2 physical assistance Stand pivot transfers: Min assist;+2 safety/equipment       General transfer comment: Use of momentum to power up and x2 attempts to come completely into standing. Cues for technqiue. Min A for RW management to pivot to recliner chair.  Ambulation/Gait                 Stairs            Wheelchair Mobility    Modified Rankin (Stroke Patients Only)       Balance Overall balance assessment: Needs  assistance Sitting-balance support: Feet supported Sitting balance-Leahy Scale: Fair Sitting balance - Comments: pt able to sit EOB for ~3 min w/o support   Standing balance support: During functional activity Standing balance-Leahy Scale: Poor Standing balance comment: relinat on UE support                            Cognition Arousal/Alertness: Awake/alert Behavior During Therapy: WFL for tasks assessed/performed Overall Cognitive Status: Within Functional Limits for tasks assessed                                        Exercises      General Comments        Pertinent Vitals/Pain Pain Assessment: No/denies pain    Home Living                      Prior Function            PT Goals (current goals can now be found in the care plan section) Acute Rehab PT Goals Patient Stated Goal: get well and go home PT Goal Formulation: Patient unable to participate in goal setting Time For Goal Achievement: 04/30/17 Potential to Achieve Goals: Fair Progress towards PT goals: Progressing toward goals    Frequency    Min 3X/week      PT Plan Current plan remains  appropriate    Co-evaluation              AM-PAC PT "6 Clicks" Daily Activity  Outcome Measure  Difficulty turning over in bed (including adjusting bedclothes, sheets and blankets)?: Unable Difficulty moving from lying on back to sitting on the side of the bed? : Unable Difficulty sitting down on and standing up from a chair with arms (e.g., wheelchair, bedside commode, etc,.)?: Unable Help needed moving to and from a bed to chair (including a wheelchair)?: A Little Help needed walking in hospital room?: A Lot Help needed climbing 3-5 steps with a railing? : Total 6 Click Score: 9    End of Session Equipment Utilized During Treatment: Gait belt;Oxygen Activity Tolerance: Patient tolerated treatment well Patient left: with call bell/phone within reach;in chair;with  chair alarm set Nurse Communication: Mobility status PT Visit Diagnosis: Other abnormalities of gait and mobility (R26.89);Muscle weakness (generalized) (M62.81);Ataxic gait (R26.0)     Time: 7017-7939 PT Time Calculation (min) (ACUTE ONLY): 15 min  Charges:  $Therapeutic Activity: 8-22 mins                    G Codes:      Benjiman Core, [place] Pager [phone] Acute Rehab   Allena Katz 04/19/2017, 3:04 PM

## 2017-04-19 NOTE — Progress Notes (Signed)
ANTICOAGULATION CONSULT NOTE - Follow Up Consult  Pharmacy Consult for warfarin + enoxaparin Indication: hx DVT/PEs  Allergies  Allergen Reactions  . Aspirin Anaphylaxis  . Ivp Dye [Iodinated Diagnostic Agents] Anaphylaxis    Blackout  . Heparin Other (See Comments)    Patient does not know what problem was, it occurred at [facility], but this is not in [place] Patient does not know what problem was, it occurred at [facility], but this is not in [place]     Patient Measurements: Height: 5\' 7"  (170.2 cm) Weight: 170 lb 4.8 oz (77.2 kg)(b scale) IBW/kg (Calculated) : 66.1 Heparin Dosing Weight: 77.5 kg  Vital Signs: Temp: 98.2 F (36.8 C) (01/28 0900) Temp Source: Oral (01/28 0900) BP: 99/60 (01/28 0900) Pulse Rate: 79 (01/28 0618)  Labs: Recent Labs    04/17/17 0532 04/17/17 1121 04/18/17 0651 04/19/17 0819  HGB 13.3  --  13.1  --   HCT 38.6*  --  38.6*  --   PLT 118*  --  131*  --   LABPROT 18.1*  --  20.3* 24.2*  INR 1.51  --  1.76 2.19  CREATININE  --  2.44*  --  2.00*    Estimated Creatinine Clearance: 28.9 mL/min (A) (by C-G formula based on SCr of 2 mg/dL (H)).  Assessment: 76yom continues on warfarin and enoxaparin for hx multiple DVTs and PEs. INR therapeutic at 2.19 so will discontinue enoxaparin bridge today.  Home regimen is 5 mg and 2.5 mg alternating, confirmed with patient.   Goal of Therapy:  INR 2-3 Monitor platelets by anticoagulation protocol: Yes   Plan:  1) Warfarin 2.5mg  tonight 2) D/C enoxaparin 3) Daily INR  Nena Jordan, PharmD, BCPS  04/19/2017 11:48 AM

## 2017-04-19 NOTE — Progress Notes (Signed)
Pharmacy Antibiotic Note  [name] is a 77 y.o. male on ceftriaxone and azithromycin for pneumonia now with worsening CXR. Antibiotics to be broadened to vancomycin and cefepime. AKI improving with sCr 2 and CrCl ~ 42ml/min.  Vancomycin trough goal 15-20  Plan: 1) Vancomycin 1g IV x1 then 750mg  IV q4 x 8 days 2) Cefepime 1g IV q24 x 8 days 3) Follow renal function, cultures, level if needed  Height: 5\' 7"  (170.2 cm) Weight: 170 lb 4.8 oz (77.2 kg)(b scale) IBW/kg (Calculated) : 66.1  Temp (24hrs), Avg:99.1 F (37.3 C), Min:98.1 F (36.7 C), Max:100.8 F (38.2 C)  Recent Labs  Lab 04/15/17 0408 04/16/17 0605 04/17/17 0532 04/17/17 1121 04/18/17 0651 04/19/17 0819  WBC 15.1* 11.0* 14.3*  --  11.9*  --   CREATININE 2.68* 2.71*  --  2.44*  --  2.00*  LATICACIDVEN 1.3  --   --   --   --   --     Estimated Creatinine Clearance: 28.9 mL/min (A) (by C-G formula based on SCr of 2 mg/dL (H)).    Allergies  Allergen Reactions  . Aspirin Anaphylaxis  . Ivp Dye [Iodinated Diagnostic Agents] Anaphylaxis    Blackout  . Heparin Other (See Comments)    Patient does not know what problem was, it occurred at [facility], but this is not in [place] Patient does not know what problem was, it occurred at [place], but this is not in [place]     Antimicrobials this admission: Cefepime 1/23 >>1/24, resume 1/28 Vancomycin 1/23>>1/24, resume 1/28 > Ceftriaxone 1/24>> 1/28 Azithromycin 1/24>> 1/28  Microbiology results: 1/24 blood cx: ngtd 1/24 resp cx: normal flora 1/24 MRSA PCR: neg  Thank you for allowing pharmacy to be a part of this patient's care.  Deboraha Sprang 04/19/2017 11:50 AM

## 2017-04-19 NOTE — Care Management Important Message (Signed)
Important Message  Patient Details  Name: [name] MRN: [id number] Date of Birth: [birth year]   Medicare Important Message Given:  Yes    Orbie Pyo 04/19/2017, 12:18 PM

## 2017-04-19 NOTE — Progress Notes (Signed)
PROGRESS NOTE    [name]  EQA:[id number] DOB: [birth year] DOA: 04/15/2017 PCP: Octavio Graves, DO   Brief Narrative: [name] is a 77 y.o. 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 Patient presented with hypotension and encephalopathy. He was initially transferred to the ICU for vasopressor support and was quickly weaned off. He was found to have community acquired pneumonia and is on ceftriaxone/azithromycin for treatment. He was transferred out of the ICU on 1/25. PT/OT evals pending. Cultures pending.   Assessment & Plan:   Active Problems:   Septic shock (HCC)   RLL pneumonia ([place])   Septic shock Present on admission. Secondary to RLL pneumonia. Possibly some iatrogenic component. Required Levophed for a few hours in the ICU and weaned off successfully. Not intubated. Transferred out of the ICU on 1/25. -Continue treatment of pneumonia  RLL community acquired pneumonia Worsening on chest x-ray. Blood culture (1/24) with no growth to date. RVP negative. Sputum culture gram stain significant for rare gram positive cocci. Strep pneumo antigen negative. Febrile overnight. -Disontinue Ceftriaxone and azithromycin -Vancomycin and cefepime -urine strep/legionella ag/HIV -PT/OT eval: SNF -Repeat imaging in 3-4 weeks to ensure no underlying mass  Acute on chronic systolic heart failure Resolved. Last EF of 25-30% with apical/diffuse hypokinesis from 2018 echocardiogram. Patient is s/p ICD. Symptoms of dyspnea today improved but still present. Weight stable at 170 lbs. UOP 1250 mL in last 24 hours. -Continue torsemide  Essential hypertension Patient presented with hypotension. Holding antihypertensives (Lisinopril, Metolazone, Imdur). Still with soft blood pressure. -Continue Coreg for heart  failure (out of acute failure). Watch BP  Diabetes mellitus, insulin dependent Poorly controlled. -Increase to Lantus 13 units and continue SSI  CAD Atherosclerotic disease seen on recent CT. -Continue Lipitor  Pulmonary artery hypertension Moderate-severe. -outpatient follow-up  History of DVTs/PE -Continue Coumadin  History of CVA -Continue Coumadin  Hyperbilirubinemia Unknown etiology. Both direct/indirect elevated. CT scan from OSH was negative for liver pathology. No elevated transaminases. Alkaline phosphatase elevated but patient has no gallbladder. Trending down. -CMP pending   DVT prophylaxis: Lovenox/Warfarin Code Status: Full code Family Communication: None at bedside Disposition Plan: Discharge to SNF when medically stable   Consultants:   PCCM  Procedures:   None  Antimicrobials:  Vancomycin  Cefepime  Azithromycin  Ceftriaxone   Subjective: Dyspnea overnight.   Objective: Vitals:   04/18/17 2117 04/19/17 0618 04/19/17 0624 04/19/17 0900  BP: 101/60 107/61  99/60  Pulse: 69 79    Resp: 16  20 (!) 21  Temp: 99.3 F (37.4 C) 98.1 F (36.7 C)  98.2 F (36.8 C)  TempSrc: Oral Oral  Oral  SpO2: 99% 92%  91%  Weight:  77.2 kg (170 lb 4.8 oz)    Height:        Intake/Output Summary (Last 24 hours) at 04/19/2017 0923 Last data filed at 04/19/2017 9798 Gross per 24 hour  Intake 270 ml  Output 1250 ml  Net -980 ml   Filed Weights   04/17/17 0632 04/18/17 0520 04/19/17 0618  Weight: 79.2 kg (174 lb 8 oz) 77.5 kg (170 lb 12.8 oz) 77.2 kg (170 lb 4.8 oz)    Examination:  General exam: Appears calm and comfortable Respiratory system: Diminished bilaterally with rales. Cardiovascular system: Regular rate and rhythm. Normal S1 and S2.  Gastrointestinal system: Abdomen is nondistended,  soft and nontender. No organomegaly or masses felt. Normal bowel sounds heard. Central nervous system: Alert and oriented. No focal neurological  deficits. Extremities: No calf tenderness Skin: No cyanosis. No rashes Psychiatry: Judgement and insight appear normal. Mood & affect appropriate.     Data Reviewed: I have personally reviewed following labs and imaging studies  CBC: Recent Labs  Lab 04/15/17 0408 04/16/17 0605 04/17/17 0532 04/18/17 0651  WBC 15.1* 11.0* 14.3* 11.9*  NEUTROABS 11.8*  --   --   --   HGB 15.5 12.7* 13.3 13.1  HCT 45.4 37.4* 38.6* 38.6*  MCV 94.6 93.5 93.2 93.7  PLT 125* 96* 118* 308*   Basic Metabolic Panel: Recent Labs  Lab 04/15/17 0408 04/16/17 0605 04/17/17 1121  NA 136 134* 132*  K 4.3 3.7 4.1  CL 97* 98* 96*  CO2 24 23 22   GLUCOSE 161* 158* 275*  BUN 63* 72* 66*  CREATININE 2.68* 2.71* 2.44*  CALCIUM 8.6* 8.4* 8.3*  MG 2.2  --   --   PHOS 3.5  --   --    GFR: Estimated Creatinine Clearance: 23.7 mL/min (A) (by C-G formula based on SCr of 2.44 mg/dL (H)). Liver Function Tests: Recent Labs  Lab 04/15/17 0408 04/16/17 0605  AST 37 27  ALT 22 20  ALKPHOS 129* 141*  BILITOT 4.5* 3.0*  3.2*  PROT 7.5 6.7  ALBUMIN 3.1* 2.9*   No results for input(s): LIPASE, AMYLASE in the last 168 hours. No results for input(s): AMMONIA in the last 168 hours. Coagulation Profile: Recent Labs  Lab 04/15/17 0408 04/16/17 0605 04/17/17 0532 04/18/17 0651  INR 1.15 1.38 1.51 1.76   Cardiac Enzymes: Recent Labs  Lab 04/15/17 0822  TROPONINI 0.14*   BNP (last 3 results) No results for input(s): PROBNP in the last 8760 hours. HbA1C: No results for input(s): HGBA1C in the last 72 hours. CBG: Recent Labs  Lab 04/17/17 2113 04/18/17 0747 04/18/17 1145 04/18/17 1606 04/18/17 2112  GLUCAP 209* 206* 190* 219* 157*   Lipid Profile: No results for input(s): CHOL, HDL, LDLCALC, TRIG, CHOLHDL, LDLDIRECT in the last 72 hours. Thyroid Function Tests: No results for input(s): TSH, T4TOTAL, FREET4, T3FREE, THYROIDAB in the last 72 hours. Anemia Panel: No results for input(s):  VITAMINB12, FOLATE, FERRITIN, TIBC, IRON, RETICCTPCT in the last 72 hours. Sepsis Labs: Recent Labs  Lab 04/15/17 0408  PROCALCITON 0.71  LATICACIDVEN 1.3    Recent Results (from the past 240 hour(s))  Respiratory Panel by PCR     Status: None   Collection Time: 04/15/17  3:47 AM  Result Value Ref Range Status   Adenovirus NOT DETECTED NOT DETECTED Final   Coronavirus 229E NOT DETECTED NOT DETECTED Final   Coronavirus HKU1 NOT DETECTED NOT DETECTED Final   Coronavirus NL63 NOT DETECTED NOT DETECTED Final   Coronavirus OC43 NOT DETECTED NOT DETECTED Final   Metapneumovirus NOT DETECTED NOT DETECTED Final   Rhinovirus / Enterovirus NOT DETECTED NOT DETECTED Final   Influenza A NOT DETECTED NOT DETECTED Final   Influenza B NOT DETECTED NOT DETECTED Final   Parainfluenza Virus 1 NOT DETECTED NOT DETECTED Final   Parainfluenza Virus 2 NOT DETECTED NOT DETECTED Final   Parainfluenza Virus 3 NOT DETECTED NOT DETECTED Final   Parainfluenza Virus 4 NOT DETECTED NOT DETECTED Final   Respiratory Syncytial Virus NOT DETECTED NOT DETECTED Final   Bordetella pertussis NOT DETECTED NOT DETECTED Final   Chlamydophila pneumoniae NOT DETECTED NOT DETECTED Final  Mycoplasma pneumoniae NOT DETECTED NOT DETECTED Final  MRSA PCR Screening     Status: None   Collection Time: 04/15/17  3:47 AM  Result Value Ref Range Status   MRSA by PCR NEGATIVE NEGATIVE Final    Comment:        The GeneXpert MRSA Assay (FDA approved for NASAL specimens only), is one component of a comprehensive MRSA colonization surveillance program. It is not intended to diagnose MRSA infection nor to guide or monitor treatment for MRSA infections.   Culture, blood (routine x 2)     Status: None (Preliminary result)   Collection Time: 04/15/17  4:44 AM  Result Value Ref Range Status   Specimen Description BLOOD RIGHT HAND  Final   Special Requests IN PEDIATRIC BOTTLE Blood Culture adequate volume  Final   Culture NO  GROWTH 3 DAYS  Final   Report Status PENDING  Incomplete  Culture, blood (routine x 2)     Status: None (Preliminary result)   Collection Time: 04/15/17  4:46 AM  Result Value Ref Range Status   Specimen Description BLOOD RIGHT ARM  Final   Special Requests IN PEDIATRIC BOTTLE Blood Culture adequate volume  Final   Culture NO GROWTH 3 DAYS  Final   Report Status PENDING  Incomplete  Culture, expectorated sputum-assessment     Status: None   Collection Time: 04/15/17  9:10 AM  Result Value Ref Range Status   Specimen Description Expect. Sput  Final   Special Requests NONE  Final   Sputum evaluation THIS SPECIMEN IS ACCEPTABLE FOR SPUTUM CULTURE  Final   Report Status 04/15/2017 FINAL  Final  Culture, respiratory (NON-Expectorated)     Status: None   Collection Time: 04/15/17  9:10 AM  Result Value Ref Range Status   Specimen Description Expect. Sput  Final   Special Requests NONE Reflexed from [id number]  Final   Gram Stain   Final    RARE WBC PRESENT,BOTH PMN AND MONONUCLEAR RARE GRAM POSITIVE COCCI    Culture FEW Consistent with normal respiratory flora.  Final   Report Status 04/17/2017 FINAL  Final         Radiology Studies: Dg Chest Port 1 View  Result Date: 04/19/2017 CLINICAL DATA:  77 year old male with shortness of breath. Being treated for pneumonia. EXAM: PORTABLE CHEST 1 VIEW COMPARISON:  04/17/2017 and earlier. FINDINGS: Portable AP semi upright view at 0734 hr. Mildly lower lung volumes. Progressive right lung opacification since 04/16/2017, with widespread density sparing a portion of the right lung apex and lung along the right hemidiaphragm. The right hilum is obscured. Right lung ventilation has been stable since 04/16/2017 and within normal limits allowing for portable technique. Stable cardiac size and mediastinal contours. Prior CABG stable left chest cardiac AICD. No acute osseous abnormality identified. IMPRESSION: 1. Continued worsening right lung ventilation  and increasing opacity since 04/17/2017 suggesting progressive multilobar pneumonia. 2. The left lung remains clear. Electronically Signed   By: Genevie Ann M.D.   On: 04/19/2017 07:54   Dg Chest Port 1 View  Result Date: 04/17/2017 CLINICAL DATA:  Rales and dyspnea.  Being treated for pneumonia. EXAM: PORTABLE CHEST 1 VIEW COMPARISON:  Yesterday FINDINGS: Biventricular ICD/pacer from the left in stable position. Chronic cardiomegaly. Status post CABG. There is asymmetric lung opacity on the right with probable small effusion. Left lung is comparatively clear. No generalized Kerley lines. No pneumothorax. IMPRESSION: Progressive right-sided pneumonia with probable small pleural effusion. Electronically Signed   By: Angelica Chessman  Watts M.D.   On: 04/17/2017 14:12        Scheduled Meds: . atorvastatin  40 mg Oral Daily  . carvedilol  3.125 mg Oral BID WC  . enoxaparin (LOVENOX) injection  1 mg/kg Subcutaneous Q24H  . insulin aspart  0-15 Units Subcutaneous TID WC  . insulin aspart  0-5 Units Subcutaneous QHS  . insulin glargine  13 Units Subcutaneous QHS  . levETIRAcetam  500 mg Oral BID  . mouth rinse  15 mL Mouth Rinse BID  . senna-docusate  1 tablet Oral BID  . torsemide  50 mg Oral BID  . Warfarin - Pharmacist Dosing Inpatient   Does not apply q1800   Continuous Infusions: . sodium chloride    . ceFEPime (MAXIPIME) IV    . vancomycin       LOS: 4 days     Cordelia Poche, MD [facility] 04/19/2017, 9:23 AM Pager: [phone]  If 7PM-7AM, please contact night-coverage [email or web address] Password [email or web address] 04/19/2017, 9:23 AM

## 2017-04-20 DIAGNOSIS — E1122 Type 2 diabetes mellitus with diabetic chronic kidney disease: Secondary | ICD-10-CM

## 2017-04-20 DIAGNOSIS — N183 Chronic kidney disease, stage 3 unspecified: Secondary | ICD-10-CM

## 2017-04-20 DIAGNOSIS — I8290 Acute embolism and thrombosis of unspecified vein: Secondary | ICD-10-CM

## 2017-04-20 DIAGNOSIS — I2510 Atherosclerotic heart disease of native coronary artery without angina pectoris: Secondary | ICD-10-CM

## 2017-04-20 LAB — GLUCOSE, CAPILLARY
GLUCOSE-CAPILLARY: 138 mg/dL — AB (ref 65–99)
Glucose-Capillary: 181 mg/dL — ABNORMAL HIGH (ref 65–99)
Glucose-Capillary: 304 mg/dL — ABNORMAL HIGH (ref 65–99)
Glucose-Capillary: 254 mg/dL — ABNORMAL HIGH (ref 65–99)

## 2017-04-20 LAB — BASIC METABOLIC PANEL
ANION GAP: 17 — AB (ref 5–15)
BUN: 64 mg/dL — ABNORMAL HIGH (ref 6–20)
CALCIUM: 8.5 mg/dL — AB (ref 8.9–10.3)
CO2: 24 mmol/L (ref 22–32)
CREATININE: 2.19 mg/dL — AB (ref 0.61–1.24)
Chloride: 97 mmol/L — ABNORMAL LOW (ref 101–111)
GFR, EST AFRICAN AMERICAN: 32 mL/min — AB (ref >60)
GFR, EST NON AFRICAN AMERICAN: 27 mL/min — AB (ref >60)
GLUCOSE: 206 mg/dL — AB (ref 65–99)
Potassium: 3.7 mmol/L (ref 3.5–5.1)
Sodium: 138 mmol/L (ref 135–145)

## 2017-04-20 LAB — EXPECTORATED SPUTUM ASSESSMENT W REFEX TO RESP CULTURE

## 2017-04-20 LAB — EXPECTORATED SPUTUM ASSESSMENT W GRAM STAIN, RFLX TO RESP C

## 2017-04-20 LAB — CULTURE, BLOOD (ROUTINE X 2)
CULTURE: NO GROWTH
Culture: NO GROWTH
Special Requests: ADEQUATE
Special Requests: ADEQUATE

## 2017-04-20 LAB — HIV ANTIBODY (ROUTINE TESTING W REFLEX): HIV SCREEN 4TH GENERATION: NONREACTIVE

## 2017-04-20 LAB — PROTIME-INR
INR: 1.99
PROTHROMBIN TIME: 22.4 s — AB (ref 11.4–15.2)

## 2017-04-20 MED ORDER — INSULIN ASPART 100 UNIT/ML ~~LOC~~ SOLN
0.0000 [IU] | Freq: Three times a day (TID) | SUBCUTANEOUS | Status: DC
  Administered 2017-04-20: 5 [IU] via SUBCUTANEOUS
  Administered 2017-04-20: 7 [IU] via SUBCUTANEOUS
  Administered 2017-04-21: 2 [IU] via SUBCUTANEOUS

## 2017-04-20 MED ORDER — INSULIN GLARGINE 100 UNIT/ML ~~LOC~~ SOLN
15.0000 [IU] | Freq: Every day | SUBCUTANEOUS | Status: DC
  Administered 2017-04-20: 15 [IU] via SUBCUTANEOUS
  Filled 2017-04-20 (×2): qty 0.15

## 2017-04-20 MED ORDER — TRAMADOL HCL 50 MG PO TABS
50.0000 mg | ORAL_TABLET | Freq: Four times a day (QID) | ORAL | Status: DC | PRN
  Administered 2017-04-20 (×2): 50 mg via ORAL
  Filled 2017-04-20 (×2): qty 1

## 2017-04-20 MED ORDER — WARFARIN SODIUM 5 MG PO TABS
5.0000 mg | ORAL_TABLET | Freq: Once | ORAL | Status: AC
  Administered 2017-04-20: 5 mg via ORAL
  Filled 2017-04-20: qty 1

## 2017-04-20 MED ORDER — INSULIN ASPART 100 UNIT/ML ~~LOC~~ SOLN
3.0000 [IU] | Freq: Three times a day (TID) | SUBCUTANEOUS | Status: DC
  Administered 2017-04-20 (×2): 3 [IU] via SUBCUTANEOUS

## 2017-04-20 NOTE — Progress Notes (Signed)
ANTICOAGULATION CONSULT NOTE - Follow Up Consult  Pharmacy Consult for warfarin Indication: hx DVT/PEs  Allergies  Allergen Reactions  . Aspirin Anaphylaxis  . Ivp Dye [Iodinated Diagnostic Agents] Anaphylaxis    Blackout  . Heparin Other (See Comments)    Patient does not know what problem was, it occurred at [facility], but this is not in [place] Patient does not know what problem was, it occurred at [facility], but this is not in [place]     Patient Measurements: Height: 5\' 7"  (170.2 cm) Weight: 169 lb 11.2 oz (77 kg)(b scale) IBW/kg (Calculated) : 66.1 Heparin Dosing Weight: 77.5 kg  Vital Signs: Temp: 98.7 F (37.1 C) (01/29 0535) Temp Source: Oral (01/29 0535) BP: 99/67 (01/29 0535) Pulse Rate: 71 (01/29 0535)  Labs: Recent Labs    04/17/17 1121 04/18/17 0651 04/19/17 0819 04/20/17 0545  HGB  --  13.1  --   --   HCT  --  38.6*  --   --   PLT  --  131*  --   --   LABPROT  --  20.3* 24.2* 22.4*  INR  --  1.76 2.19 1.99  CREATININE 2.44*  --  2.00* 2.19*    Estimated Creatinine Clearance: 26.4 mL/min (A) (by C-G formula based on SCr of 2.19 mg/dL (H)).  Assessment: 77 yo M continues on warfarin for hx multiple DVTs and PEs. INR with slight trend down.  Due for higher warfarin dose today based on home regimen.  Home regimen is 5 mg and 2.5 mg alternating, confirmed with patient.   Goal of Therapy:  INR 2-3 Monitor platelets by anticoagulation protocol: Yes   Plan:  1) Warfarin 5mg  tonight 2) Daily INR  [employer], Pharm.D., BCPS Clinical Pharmacist Pager: [phone] Clinical phone for 04/20/2017 from 8:30-4:00 is [phone]. After 4pm, please call Main [facility] [phone]) for assistance. 04/20/2017 9:55 AM

## 2017-04-20 NOTE — Progress Notes (Signed)
[facility] PROGRESS NOTE    Progress Note  [name]  WUJ:[id number] DOB: 1940/12/16 DOA: 04/15/2017 PCP: Curtis Graves, DO     Brief Narrative:   [name] is an 77 y.o. 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  Empirically started on IV continue to worsen, she was transferred to the ICU and needed pressors for 1 day, during this time she was transitioned to IV vancomycin and cefepime.  Assessment/Plan:   Septic shock ([place]) due to RLL pneumonia ([place]) On admission required levo fed requiring admission to the ICU was weaned off pressors successfully, she never required intubation. Transferred to the floor on 04/16/2017 She was changed to IV vancomycin and cefepime, urine antigens are negative. She has defervesced, has remained afebrile for 24 hours we will continue IV empiric antibiotics for an additional 24 hours. Will need a repeat a chest x-ray in 3 weeks.  Acute on chronic systolic heart failure: With an EF of 25%, continue torsemide now resolved.  Essential hypertension The hypertensive medications were held due to sepsis. Blood pressure continues to be soft, will continue Coreg and continue to hold all other antihypertensive medication.  Type 2 diabetes mellitus with stage 3 chronic kidney disease (HCC) Poorly controlled as an outpatient. Continue sliding scale insulin.  Increase long-acting.  VTE (venous thromboembolism) Cont. Coumadin per pharmacy.  Hyperbilirubinemia They continue to improve, likely due to sepsis.  DVT prophylaxis: coumadin Family Communication: None Disposition Plan/Barrier to D/C:  home  In 1-2 days Code Status:     Code Status Orders    (From admission, onward)        Start     Ordered   04/15/17 0335  Full code  Continuous     04/15/17 0341    Code Status History    Date Active Date Inactive Code Status Order ID Comments User Context   11/13/2012 01:23 11/13/2012 14:00 Full Code [id number]  Theressa Millard, MD ED    Advance Directive Documentation     Most Recent Value  Type of Advance Directive  Living will  Pre-existing out of facility DNR order (yellow form or pink MOST form)  No data  "MOST" Form in Place?  No data        IV Access:    Peripheral IV   Procedures and diagnostic studies:   Dg Chest Port 1 View  Result Date: 04/19/2017 CLINICAL DATA:  77 year old male with shortness of breath. Being treated for pneumonia. EXAM: PORTABLE CHEST 1 VIEW COMPARISON:  04/17/2017 and earlier. FINDINGS: Portable AP semi upright view at 0734 hr. Mildly lower lung volumes. Progressive right lung opacification since 04/16/2017, with widespread density sparing a portion of the right lung apex and lung along the right hemidiaphragm. The right hilum is obscured. Right lung ventilation has been stable since 04/16/2017 and within normal limits allowing for portable technique. Stable cardiac size and mediastinal contours. Prior CABG stable left chest cardiac AICD. No acute osseous abnormality identified. IMPRESSION: 1. Continued worsening right lung ventilation and increasing opacity since 04/17/2017 suggesting progressive multilobar pneumonia. 2. The left lung remains clear. Electronically Signed   By: Genevie Ann M.D.   On: 04/19/2017 07:54     Medical Consultants:  None.  Anti-Infectives:   IV vancomycin and cefepime.  Subjective:    [name] he relates he feels much better today, his cough is improved his shortness of breath is also improved.  Did not have any fevers overnight.  Objective:    Vitals:   04/19/17 0900 04/19/17 1209 04/19/17 2217 04/20/17 0535  BP: 99/60 (!) 96/51 (!) 103/54 99/67   Pulse:  69 69 71  Resp: (!) 21 18 18    Temp: 98.2 F (36.8 C) 98 F (36.7 C) 99 F (37.2 C) 98.7 F (37.1 C)  TempSrc: Oral Oral Oral Oral  SpO2: 91% 92% 98% 95%  Weight:    77 kg (169 lb 11.2 oz)  Height:        Intake/Output Summary (Last 24 hours) at 04/20/2017 0957 Last data filed at 04/20/2017 0950 Gross per 24 hour  Intake 290 ml  Output 920 ml  Net -630 ml   Filed Weights   04/18/17 0520 04/19/17 0618 04/20/17 0535  Weight: 77.5 kg (170 lb 12.8 oz) 77.2 kg (170 lb 4.8 oz) 77 kg (169 lb 11.2 oz)    Exam: General exam: In no acute distress. Respiratory system: Good air movement and clear to auscultation. Cardiovascular system: S1 & S2 heard, RRR.  Gastrointestinal system: Abdomen is nondistended, soft and nontender.  Central nervous system: Alert and oriented. No focal neurological deficits. Extremities: No pedal edema. Skin: No rashes, lesions or ulcers  Data Reviewed:    Labs: Basic Metabolic Panel: Recent Labs  Lab 04/15/17 0408 04/16/17 0605 04/17/17 1121 04/19/17 0819 04/20/17 0545  NA 136 134* 132* 136 138  K 4.3 3.7 4.1 3.4* 3.7  CL 97* 98* 96* 100* 97*  CO2 24 23 22  21* 24  GLUCOSE 161* 158* 275* 250* 206*  BUN 63* 72* 66* 62* 64*  CREATININE 2.68* 2.71* 2.44* 2.00* 2.19*  CALCIUM 8.6* 8.4* 8.3* 8.0* 8.5*  MG 2.2  --   --   --   --   PHOS 3.5  --   --   --   --    GFR Estimated Creatinine Clearance: 26.4 mL/min (A) (by C-G formula based on SCr of 2.19 mg/dL (H)). Liver Function Tests: Recent Labs  Lab 04/15/17 0408 04/16/17 0605 04/19/17 0819  AST 37 27 38  ALT 22 20 21   ALKPHOS 129* 141* 168*  BILITOT 4.5* 3.0*  3.2* 1.6*  PROT 7.5 6.7 7.0  ALBUMIN 3.1* 2.9* 2.4*   No results for input(s): LIPASE, AMYLASE in the last 168 hours. No results for input(s): AMMONIA in the last 168 hours. Coagulation profile Recent Labs  Lab 04/16/17 0605 04/17/17 0532 04/18/17 0651 04/19/17 0819 04/20/17 0545  INR 1.38 1.51 1.76 2.19 1.99     CBC: Recent Labs  Lab 04/15/17 0408 04/16/17 0605 04/17/17 0532 04/18/17 0651  WBC 15.1* 11.0* 14.3* 11.9*  NEUTROABS 11.8*  --   --   --   HGB 15.5 12.7* 13.3 13.1  HCT 45.4 37.4* 38.6* 38.6*  MCV 94.6 93.5 93.2 93.7  PLT 125* 96* 118* 131*   Cardiac Enzymes: Recent Labs  Lab 04/15/17 0822  TROPONINI 0.14*   BNP (last 3 results) No results for input(s): PROBNP in the last 8760 hours. CBG: Recent Labs  Lab 04/18/17 2112 04/19/17 1208 04/19/17 1653 04/19/17 2212 04/20/17 0733  GLUCAP 157* 217* 224* 250* 181*   D-Dimer: No results for input(s): DDIMER in the last 72 hours. Hgb A1c: No results for input(s): HGBA1C in the  last 72 hours. Lipid Profile: No results for input(s): CHOL, HDL, LDLCALC, TRIG, CHOLHDL, LDLDIRECT in the last 72 hours. Thyroid function studies: No results for input(s): TSH, T4TOTAL, T3FREE, THYROIDAB in the last 72 hours.  Invalid input(s): FREET3 Anemia work up: No results for input(s): VITAMINB12, FOLATE, FERRITIN, TIBC, IRON, RETICCTPCT in the last 72 hours. Sepsis Labs: Recent Labs  Lab 04/15/17 0408 04/16/17 0605 04/17/17 0532 04/18/17 0651  PROCALCITON 0.71  --   --   --   WBC 15.1* 11.0* 14.3* 11.9*  LATICACIDVEN 1.3  --   --   --    Microbiology Recent Results (from the past 240 hour(s))  Respiratory Panel by PCR     Status: None   Collection Time: 04/15/17  3:47 AM  Result Value Ref Range Status   Adenovirus NOT DETECTED NOT DETECTED Final   Coronavirus 229E NOT DETECTED NOT DETECTED Final   Coronavirus HKU1 NOT DETECTED NOT DETECTED Final   Coronavirus NL63 NOT DETECTED NOT DETECTED Final   Coronavirus OC43 NOT DETECTED NOT DETECTED Final   Metapneumovirus NOT DETECTED NOT DETECTED Final   Rhinovirus / Enterovirus NOT DETECTED NOT DETECTED Final   Influenza A NOT DETECTED NOT DETECTED Final   Influenza B NOT DETECTED NOT DETECTED Final   Parainfluenza Virus 1 NOT DETECTED NOT DETECTED Final   Parainfluenza Virus  2 NOT DETECTED NOT DETECTED Final   Parainfluenza Virus 3 NOT DETECTED NOT DETECTED Final   Parainfluenza Virus 4 NOT DETECTED NOT DETECTED Final   Respiratory Syncytial Virus NOT DETECTED NOT DETECTED Final   Bordetella pertussis NOT DETECTED NOT DETECTED Final   Chlamydophila pneumoniae NOT DETECTED NOT DETECTED Final   Mycoplasma pneumoniae NOT DETECTED NOT DETECTED Final  MRSA PCR Screening     Status: None   Collection Time: 04/15/17  3:47 AM  Result Value Ref Range Status   MRSA by PCR NEGATIVE NEGATIVE Final    Comment:        The GeneXpert MRSA Assay (FDA approved for NASAL specimens only), is one component of a comprehensive MRSA colonization surveillance program. It is not intended to diagnose MRSA infection nor to guide or monitor treatment for MRSA infections.   Culture, blood (routine x 2)     Status: None   Collection Time: 04/15/17  4:44 AM  Result Value Ref Range Status   Specimen Description BLOOD RIGHT HAND  Final   Special Requests IN PEDIATRIC BOTTLE Blood Culture adequate volume  Final   Culture NO GROWTH 5 DAYS  Final   Report Status 04/20/2017 FINAL  Final  Culture, blood (routine x 2)     Status: None   Collection Time: 04/15/17  4:46 AM  Result Value Ref Range Status   Specimen Description BLOOD RIGHT ARM  Final   Special Requests IN PEDIATRIC BOTTLE Blood Culture adequate volume  Final   Culture NO GROWTH 5 DAYS  Final   Report Status 04/20/2017 FINAL  Final  Culture, expectorated sputum-assessment     Status: None   Collection Time: 04/15/17  9:10 AM  Result Value Ref Range Status   Specimen Description Expect. Sput  Final   Special Requests NONE  Final   Sputum evaluation THIS SPECIMEN IS ACCEPTABLE FOR SPUTUM CULTURE  Final   Report Status 04/15/2017 FINAL  Final  Culture, respiratory (NON-Expectorated)     Status: None   Collection Time: 04/15/17  9:10 AM  Result Value Ref Range Status   Specimen Description Expect. Sput  Final  Special  Requests NONE Reflexed from [id number]  Final   Gram Stain   Final    RARE WBC PRESENT,BOTH PMN AND MONONUCLEAR RARE GRAM POSITIVE COCCI    Culture FEW Consistent with normal respiratory flora.  Final   Report Status 04/17/2017 FINAL  Final  Culture, sputum-assessment     Status: None   Collection Time: 04/20/17  6:21 AM  Result Value Ref Range Status   Specimen Description SPUTUM  Final   Special Requests NONE  Final   Sputum evaluation   Final    Sputum specimen not acceptable for testing.  Please recollect.   Results Called to: Westly Pam, RN AT 0820 ON 04/20/17 BY C. JESSUP, MLT.    Report Status 04/20/2017 FINAL  Final     Medications:   . atorvastatin  40 mg Oral Daily  . carvedilol  3.125 mg Oral BID WC  . insulin aspart  0-15 Units Subcutaneous TID WC  . insulin aspart  0-5 Units Subcutaneous QHS  . insulin glargine  13 Units Subcutaneous QHS  . levETIRAcetam  500 mg Oral BID  . mouth rinse  15 mL Mouth Rinse BID  . senna-docusate  1 tablet Oral BID  . torsemide  50 mg Oral BID  . warfarin  5 mg Oral ONCE-1800  . Warfarin - Pharmacist Dosing Inpatient   Does not apply q1800   Continuous Infusions: . sodium chloride    . ceFEPime (MAXIPIME) IV 1 g (04/20/17 0836)  . vancomycin       LOS: 5 days   [facility] Pager [phone]  *Please refer to [email or web address], password [email or web address] to get updated schedule on who will round on this patient, as hospitalists switch teams weekly. If 7PM-7AM, please contact night-coverage at [email or web address], password [email or web address] for any overnight needs.  04/20/2017, 9:57 AM

## 2017-04-20 NOTE — Progress Notes (Signed)
Patient has been saying inappropriate comments  To staff like " have you been F------ng this morning  And "I'm going to bring you home and I will show your husband F------ng you naked". I told the patient that his comments are very wrong and  that's not how you talk  To your  nurse and to nurse techs because we are  The one's taking care of you .  And he will just stare at you as if you're not existing.

## 2017-04-20 NOTE — Clinical Social Work Note (Signed)
RN notified CSW of patient's behaviors. CSW paged MD to ask for psych consult.  Day[place] Scrape, [place]

## 2017-04-20 NOTE — Progress Notes (Signed)
Physical Therapy Treatment Patient Details Name: [name] MRN: [id number] DOB: May 08, 1940 Today's Date: 04/20/2017    History of Present Illness pt is a 77 y/o male with pmh significant for CKD, ICM, CAD post CABG, multiple CVA's, recurrent DVT/PE's, OSA and AAA repair admitted as a lateral transfer from [facility] due to s/s of flu then development of PNA.    PT Comments    No c/o pain.  Pt appropriate during this session, however did demonstrate some deficits with orientation, recall, and awareness.  Session focus on progression of strength and functional mobility.  Pt able to mobilize with min guard today, max gait distance 60' with RW.  O2 following ambulation on RA at 80%, quickly returned to 89% once sitting.  Placed on 1L via [place] and immediately O2 returned to 92%.      Follow Up Recommendations  SNF;Supervision/Assistance - 24 hour     Equipment Recommendations       Recommendations for Other Services       Precautions / Restrictions Precautions Precautions: Fall Restrictions Weight Bearing Restrictions: No    Mobility  Bed Mobility               General bed mobility comments: in chair on arrival  Transfers Overall transfer level: Needs assistance Equipment used: Rolling walker (2 wheeled) Transfers: Sit to/from Stand Sit to Stand: Min guard         General transfer comment: no physical assist for lifting, only required tactile cue to maintain forward weightshift during transfer  Ambulation/Gait Ambulation/Gait assistance: Min guard Ambulation Distance (Feet): 75 Feet Assistive device: Rolling walker (2 wheeled) Gait Pattern/deviations: Decreased stride length Gait velocity: slow       Stairs            Wheelchair Mobility    Modified Rankin (Stroke Patients Only)       Balance     Sitting balance-Leahy Scale: Fair     Standing balance support: During functional activity Standing balance-Leahy Scale: Fair                              Cognition Arousal/Alertness: Awake/alert Behavior During Therapy: WFL for tasks assessed/performed Overall Cognitive Status: Within Functional Limits for tasks assessed                                        Exercises General Exercises - Lower Extremity Ankle Circles/Pumps: AROM;Both;10 reps;Seated Long Arc Quad: AROM;Both;10 reps;Seated Hip Flexion/Marching: AROM;Both;10 reps;Seated    General Comments        Pertinent Vitals/Pain Pain Assessment: No/denies pain    Home Living                      Prior Function            PT Goals (current goals can now be found in the care plan section) Acute Rehab PT Goals Patient Stated Goal: get well and go home PT Goal Formulation: Patient unable to participate in goal setting Time For Goal Achievement: 04/30/17 Potential to Achieve Goals: Fair Progress towards PT goals: Progressing toward goals    Frequency    Min 3X/week      PT Plan Current plan remains appropriate    Co-evaluation              AM-PAC PT "6  Clicks" Daily Activity  Outcome Measure  Difficulty turning over in bed (including adjusting bedclothes, sheets and blankets)?: A Little Difficulty moving from lying on back to sitting on the side of the bed? : A Little Difficulty sitting down on and standing up from a chair with arms (e.g., wheelchair, bedside commode, etc,.)?: A Little Help needed moving to and from a bed to chair (including a wheelchair)?: A Little Help needed walking in hospital room?: A Little Help needed climbing 3-5 steps with a railing? : A Lot 6 Click Score: 17    End of Session Equipment Utilized During Treatment: Gait belt Activity Tolerance: Patient tolerated treatment well Patient left: in chair;with call bell/phone within reach;with chair alarm set Nurse Communication: Mobility status PT Visit Diagnosis: Other abnormalities of gait and mobility (R26.89);Muscle weakness  (generalized) (M62.81);Ataxic gait (R26.0)     Time: 2409-7353 PT Time Calculation (min) (ACUTE ONLY): 17 min  Charges:  $Gait Training: 8-22 mins                    G Codes:          Michel Santee 04/20/2017, 2:55 PM

## 2017-04-20 NOTE — Progress Notes (Signed)
Pt was informed to use the urinal

## 2017-04-21 DIAGNOSIS — R0989 Other specified symptoms and signs involving the circulatory and respiratory systems: Secondary | ICD-10-CM

## 2017-04-21 DIAGNOSIS — J181 Lobar pneumonia, unspecified organism: Secondary | ICD-10-CM

## 2017-04-21 DIAGNOSIS — I8290 Acute embolism and thrombosis of unspecified vein: Secondary | ICD-10-CM

## 2017-04-21 DIAGNOSIS — I10 Essential (primary) hypertension: Secondary | ICD-10-CM

## 2017-04-21 DIAGNOSIS — R0600 Dyspnea, unspecified: Secondary | ICD-10-CM

## 2017-04-21 DIAGNOSIS — N183 Chronic kidney disease, stage 3 (moderate): Secondary | ICD-10-CM

## 2017-04-21 DIAGNOSIS — J9601 Acute respiratory failure with hypoxia: Secondary | ICD-10-CM

## 2017-04-21 DIAGNOSIS — E1122 Type 2 diabetes mellitus with diabetic chronic kidney disease: Secondary | ICD-10-CM

## 2017-04-21 LAB — COMPREHENSIVE METABOLIC PANEL
ALBUMIN: 2.4 g/dL — AB (ref 3.5–5.0)
ALT: 23 U/L (ref 17–63)
ANION GAP: 16 — AB (ref 5–15)
AST: 41 U/L (ref 15–41)
Alkaline Phosphatase: 192 U/L — ABNORMAL HIGH (ref 38–126)
BUN: 65 mg/dL — ABNORMAL HIGH (ref 6–20)
CO2: 23 mmol/L (ref 22–32)
Calcium: 8.5 mg/dL — ABNORMAL LOW (ref 8.9–10.3)
Chloride: 100 mmol/L — ABNORMAL LOW (ref 101–111)
Creatinine, Ser: 1.88 mg/dL — ABNORMAL HIGH (ref 0.61–1.24)
GFR calc non Af Amer: 33 mL/min — ABNORMAL LOW (ref >60)
GFR, EST AFRICAN AMERICAN: 38 mL/min — AB (ref >60)
GLUCOSE: 152 mg/dL — AB (ref 65–99)
POTASSIUM: 3.2 mmol/L — AB (ref 3.5–5.1)
SODIUM: 139 mmol/L (ref 135–145)
Total Bilirubin: 1.8 mg/dL — ABNORMAL HIGH (ref 0.3–1.2)
Total Protein: 7.3 g/dL (ref 6.5–8.1)

## 2017-04-21 LAB — CBC
HEMATOCRIT: 41.4 % (ref 39.0–52.0)
HEMOGLOBIN: 14.4 g/dL (ref 13.0–17.0)
MCH: 32.2 pg (ref 26.0–34.0)
MCHC: 34.8 g/dL (ref 30.0–36.0)
MCV: 92.6 fL (ref 78.0–100.0)
Platelets: 154 10*3/uL (ref 150–400)
RBC: 4.47 MIL/uL (ref 4.22–5.81)
RDW: 14.7 % (ref 11.5–15.5)
WBC: 12.2 10*3/uL — ABNORMAL HIGH (ref 4.0–10.5)

## 2017-04-21 LAB — MAGNESIUM: Magnesium: 2.4 mg/dL (ref 1.7–2.4)

## 2017-04-21 LAB — PHOSPHORUS: PHOSPHORUS: 3.9 mg/dL (ref 2.5–4.6)

## 2017-04-21 LAB — GLUCOSE, CAPILLARY
GLUCOSE-CAPILLARY: 165 mg/dL — AB (ref 65–99)
Glucose-Capillary: 120 mg/dL — ABNORMAL HIGH (ref 65–99)

## 2017-04-21 LAB — PROTIME-INR
INR: 2.45
PROTHROMBIN TIME: 26.4 s — AB (ref 11.4–15.2)

## 2017-04-21 MED ORDER — INSULIN GLARGINE 100 UNIT/ML ~~LOC~~ SOLN: 15.0000 [IU] | Freq: Every day | SUBCUTANEOUS | 11 refills | Status: AC

## 2017-04-21 MED ORDER — SENNOSIDES-DOCUSATE SODIUM 8.6-50 MG PO TABS: 1.0000 | ORAL_TABLET | Freq: Two times a day (BID) | ORAL | 0 refills | Status: AC

## 2017-04-21 MED ORDER — TORSEMIDE 10 MG PO TABS: 50.0000 mg | ORAL_TABLET | Freq: Two times a day (BID) | ORAL | 0 refills | Status: AC

## 2017-04-21 MED ORDER — WARFARIN SODIUM 2.5 MG PO TABS: 2.5000 mg | ORAL_TABLET | Freq: Once | ORAL | Status: DC

## 2017-04-21 MED ORDER — TRAMADOL HCL 50 MG PO TABS: 50.0000 mg | ORAL_TABLET | Freq: Four times a day (QID) | ORAL | 0 refills | Status: AC | PRN

## 2017-04-21 MED ORDER — CEFDINIR 300 MG PO CAPS: 300.0000 mg | ORAL_CAPSULE | Freq: Every day | ORAL | 0 refills | Status: AC

## 2017-04-21 MED ORDER — POTASSIUM CHLORIDE CRYS ER 20 MEQ PO TBCR: 40.0000 meq | EXTENDED_RELEASE_TABLET | Freq: Two times a day (BID) | ORAL | Status: DC

## 2017-04-21 NOTE — Clinical Social Work Note (Signed)
Per MD, patient fully alert and oriented today. CSW met with patient and reviewed bed offers. He is agreeable to discharging to [facility]. SNF admissions coordinator notified and they can take him today. CSW paged MD to notify.  [place] Scrape, [place]

## 2017-04-21 NOTE — Discharge Summary (Addendum)
Physician Discharge Summary  [name] EYC:[id number] DOB: [birth year] DOA: 04/15/2017  PCP: Octavio Graves, DO  Admit date: 04/15/2017 Discharge date: 04/21/2017  Admitted From: Home Disposition:  SNF  Recommendations for Outpatient Follow-up:  1. Follow up with PCP in 1-2 weeks 2. Please obtain CMP/CBC, Mag, Phos in one week 3. Repeat PT-INR in AM  4. Repeat CXR in 3 weeks  5. Please follow up on the following pending results:  Home Health: No Equipment/Devices: None recommended by PT  Discharge Condition: Stable CODE STATUS: FULL CODE Diet recommendation: Heart Healthy Carb Modified Diet  Brief/Interim Summary: [name] is an 77 y.o. 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  Empirically started on IV Abx and continued to worsen; He was transferred to the ICU and needed pressors for 1 day, during this time he was transitioned to IV vancomycin and cefepime. Patient improved and felt better and improved. No CP or SOB. Deemed stable to D/C to SNF and will need to follow up with PCP and Pulmonary in 1 week.   Discharge Diagnoses:  Active Problems:   Hypertension   Septic shock (HCC)   RLL pneumonia (HCC)   Type 2 diabetes mellitus with stage 3 chronic kidney disease (HCC)   VTE (venous thromboembolism)   Hyperbilirubinemia   CAD (coronary artery disease)  Septic shock (HCC) due to RLL pneumonia ([place]) -On admission  -required levo fed requiring admission to the ICU was weaned off pressors successfully, and he never required intubation. -Transferred to the floor on 04/16/2017 -He was changed to IV vancomycin and cefepime, urine antigens are negative. -He has defervesced, has remained  afebrile for 24 hours we will continued IV empiric antibiotics for an additional 24 hours and transitioned to po Abx -Will need a repeat a chest x-ray in 3 weeks.  Acute on chronic systolic heart failure: -With an EF of 25%, continue torsemide at Home -Improved -C/w ACE and BB -C/w Home Diuretics with Torsemide -Patient is -2.253 Liters -Continue to monitor volume status   Acute Respiratory Failure with Hypoxia -From above -Patient desaturated on Ambulation and placed on 1 Liter -Will need O2 at D/C -Follow up with O2 weaning and Maintain O2 Saturation >92%  Essential Hypertension -The hypertensive medications were held due to sepsis. -Blood pressure continueed to be soft,  -will continue Coreg and continue Home Medications as an outpatient   Type 2 diabetes mellitus with stage 3 chronic kidney disease ([place]) -Poorly controlled as an outpatient. -Takes both Lantus and Insulin NPH (confirmed by Pharmacy) -Follow up with PCP for Blood Sugar management  VTE (Venous Thromboembolism) -Hx of Bilateral DVT's -C/w Coumadin at Home Dose   Hyperbilirubinemia -Mild at 1.8 -Likely from Sepsis -Continue to Monitor as an outpatient.   CKD Stage 3 -BUN/Cr stable -Follow up CMP as an outpatient   Hypokalemia -Was 3.2 -Replete prior to D/C; C/w Home KDur 20 mg BID -Repeat CMP as an outpatient   Discharge Instructions Discharge Instructions    Call MD for:  difficulty breathing, headache or visual disturbances   Complete by:  As directed    Call MD for:  extreme fatigue   Complete by:  As directed    Call MD for:  hives  Complete by:  As directed    Call MD for:  persistant dizziness or light-headedness   Complete by:  As directed    Call MD for:  persistant nausea and vomiting   Complete by:  As directed    Call MD for:  redness, tenderness, or signs of infection (pain, swelling, redness, odor or green/yellow discharge around incision site)   Complete by:  As directed     Call MD for:  severe uncontrolled pain   Complete by:  As directed    Call MD for:  temperature >100.4   Complete by:  As directed    Diet - low sodium heart healthy   Complete by:  As directed    Diet Carb Modified   Complete by:  As directed    Discharge instructions   Complete by:  As directed    Follow with PCP and Cardiology as an outpatient. Take all medications as prescribed. If symptoms change or worsen please return to the ED for evaluation.   Increase activity slowly   Complete by:  As directed      Allergies as of 04/21/2017      Reactions   Aspirin Anaphylaxis   Ivp Dye [iodinated Diagnostic Agents] Anaphylaxis   Blackout   Heparin Other (See Comments)   Patient does not know what problem was, it occurred at [place], but this is not in [place] Patient does not know what problem was, it occurred at [place], but this is not in [place]      Medication List    STOP taking these medications   docusate sodium 100 MG capsule Commonly known as:  COLACE   furosemide 10 MG/ML injection Commonly known as:  LASIX   furosemide 40 MG tablet Commonly known as:  LASIX   HYDROcodone-acetaminophen 5-325 MG tablet Commonly known as:  NORCO/VICODIN   insulin detemir 100 UNIT/ML injection Commonly known as:  LEVEMIR   LANTUS SOLOSTAR 100 UNIT/ML Solostar Pen Generic drug:  Insulin Glargine Replaced by:  insulin glargine 100 UNIT/ML injection   metolazone 2.5 MG tablet Commonly known as:  ZAROXOLYN   oxyCODONE-acetaminophen 10-325 MG tablet Commonly known as:  PERCOCET   ramipril 2.5 MG capsule Commonly known as:  ALTACE     TAKE these medications   allopurinol 100 MG tablet Commonly known as:  ZYLOPRIM Take 100 mg by mouth daily.   atorvastatin 40 MG tablet Commonly known as:  LIPITOR Take 40 mg by mouth daily.   calcium carbonate 500 MG chewable tablet Commonly known as:  TUMS - dosed in mg elemental  calcium Chew 1 tablet by mouth 2 (two) times daily.   carvedilol 3.125 MG tablet Commonly known as:  COREG Take 3.125 mg by mouth 2 (two) times daily with a meal.   cefdinir 300 MG capsule Commonly known as:  OMNICEF Take 1 capsule (300 mg total) by mouth daily.   cholecalciferol 1000 units tablet Commonly known as:  VITAMIN D Take 1,000 Units by mouth daily.   gabapentin 300 MG capsule Commonly known as:  NEURONTIN Take 300 mg by mouth 3 (three) times daily.   insulin glargine 100 UNIT/ML injection Commonly known as:  LANTUS Inject 0.15 mLs (15 Units total) into the skin at bedtime. Replaces:  LANTUS SOLOSTAR 100 UNIT/ML Solostar Pen   insulin NPH Human 100 UNIT/ML injection Commonly known as:  HUMULIN N,NOVOLIN N Inject 15 Units into the skin 2 (two) times daily before a meal.  isosorbide mononitrate 30 MG 24 hr tablet Commonly known as:  IMDUR Take 30 mg by mouth daily.   levETIRAcetam 500 MG tablet Commonly known as:  KEPPRA Take 500 mg by mouth 2 (two) times daily. Patient not taking   lisinopril 2.5 MG tablet Commonly known as:  PRINIVIL,ZESTRIL Take 2.5 mg by mouth daily.   nitroGLYCERIN 0.4 MG SL tablet Commonly known as:  NITROSTAT Place 0.4 mg under the tongue every 5 (five) minutes as needed for chest pain.   potassium chloride SA 20 MEQ tablet Commonly known as:  K-DUR,KLOR-CON Take 20 mEq by mouth 2 (two) times daily.   psyllium 58.6 % powder Commonly known as:  METAMUCIL Take 1 packet by mouth 2 (two) times daily.   ranitidine 75 MG tablet Commonly known as:  ZANTAC Take 150 mg by mouth 2 (two) times daily.   ranolazine 500 MG 12 hr tablet Commonly known as:  RANEXA Take 500 mg by mouth 2 (two) times daily.   senna-docusate 8.6-50 MG tablet Commonly known as:  Senokot-S Take 1 tablet by mouth 2 (two) times daily.   temazepam 15 MG capsule Commonly known as:  RESTORIL Take 15 mg by mouth at bedtime.   torsemide 10 MG tablet Commonly  known as:  DEMADEX Take 5 tablets (50 mg total) by mouth 2 (two) times daily. What changed:    medication strength  when to take this  additional instructions   traMADol 50 MG tablet Commonly known as:  ULTRAM Take 1 tablet (50 mg total) by mouth every 6 (six) hours as needed for moderate pain.   vitamin C 500 MG tablet Commonly known as:  ASCORBIC ACID Take 500 mg by mouth daily.   Vitamin D (Ergocalciferol) 50000 units Caps capsule Commonly known as:  DRISDOL Take 50,000 Units by mouth every 7 (seven) days.   warfarin 5 MG tablet Commonly known as:  COUMADIN Take 5 mg by mouth See admin instructions. Patient takes 5mg  every other day alternating with 2.5mg  .      Contact information for after-discharge care    Destination    [facility] .   Service:  Skilled Nursing Contact information: [place] [phone]             Allergies  Allergen Reactions  . Aspirin Anaphylaxis  . Ivp Dye [Iodinated Diagnostic Agents] Anaphylaxis    Blackout  . Heparin Other (See Comments)    Patient does not know what problem was, it occurred at [place], but this is not in [place] Patient does not know what problem was, it occurred at [place], but this is not in [place]    Consultations:  PCCM  Procedures/Studies: Dg Chest Port 1 View  Result Date: 04/19/2017 CLINICAL DATA:  77 year old male with shortness of breath. Being treated for pneumonia. EXAM: PORTABLE CHEST 1 VIEW COMPARISON:  04/17/2017 and earlier. FINDINGS: Portable AP semi upright view at 0734 hr. Mildly lower lung volumes. Progressive right lung opacification since 04/16/2017, with widespread density sparing a portion of the right lung apex and lung along the right hemidiaphragm. The right hilum is obscured. Right lung ventilation has been stable since 04/16/2017 and within normal limits allowing for  portable technique. Stable cardiac size and mediastinal contours. Prior CABG stable left chest cardiac AICD. No acute osseous abnormality identified. IMPRESSION: 1. Continued worsening right lung ventilation and increasing opacity since 04/17/2017 suggesting progressive multilobar pneumonia. 2. The  left lung remains clear. Electronically Signed   By: Genevie Ann M.D.   On: 04/19/2017 07:54   Dg Chest Port 1 View  Result Date: 04/17/2017 CLINICAL DATA:  Rales and dyspnea.  Being treated for pneumonia. EXAM: PORTABLE CHEST 1 VIEW COMPARISON:  Yesterday FINDINGS: Biventricular ICD/pacer from the left in stable position. Chronic cardiomegaly. Status post CABG. There is asymmetric lung opacity on the right with probable small effusion. Left lung is comparatively clear. No generalized Kerley lines. No pneumothorax. IMPRESSION: Progressive right-sided pneumonia with probable small pleural effusion. Electronically Signed   By: Monte Fantasia M.D.   On: 04/17/2017 14:12   Dg Chest Port 1 View  Result Date: 04/16/2017 CLINICAL DATA:  Pneumonia. EXAM: PORTABLE CHEST 1 VIEW COMPARISON:  04/15/2017.  02/24/2017. FINDINGS: AICD noted in stable position. Prior CABG. Cardiomegaly with bibasilar interstitial prominence again noted without interim change. No pleural effusion or pneumothorax. IMPRESSION: AICD noted in stable position. Prior CABG. Cardiomegaly with mild bilateral bibasilar interstitial prominence again noted without interim change from prior exam. Electronically Signed   By: [place]   On: 04/16/2017 08:49   Dg Chest Port 1 View  Result Date: 04/15/2017 CLINICAL DATA:  Chest pain EXAM: PORTABLE CHEST 1 VIEW COMPARISON:  04/15/2017 FINDINGS: 1148 hours. The cardio pericardial silhouette is enlarged. Interstitial markings are diffusely coarsened with chronic features. Similar appearance of the patchy bibasilar airspace opacity, right greater than left. Permanent pacer/AICD again noted. The visualized  bony structures of the thorax are intact. Telemetry leads overlie the chest. IMPRESSION: 1. Stable exam with cardiomegaly and diffuse chronic interstitial changes. 2. Patchy basilar airspace disease bilaterally, right greater than left is similar to prior study. Electronically Signed   By: Misty Stanley M.D.   On: 04/15/2017 12:07   Dg Chest Port 1 View  Result Date: 04/15/2017 CLINICAL DATA:  Initial evaluation for acute hypoxia. EXAM: PORTABLE CHEST 1 VIEW COMPARISON:  Prior radiograph from 02/24/2017. FINDINGS: Median sternotomy wires with underlying CABG markers and surgical clips noted. Moderate cardiomegaly, similar to previous. Left-sided pacemaker/AICD in place. Aortic atherosclerosis. Lungs normally inflated. Patchy multifocal opacity within the right lung base, suspicious for possible infiltrate. Sequelae of aspiration could also be considered. Perihilar vascular congestion without pulmonary edema. No definite pleural effusion. No pneumothorax. No acute osseous abnormality. IMPRESSION: 1. Patchy right basilar opacity, suspicious for possible infiltrate. Sequelae of aspiration could also be considered. 2. Cardiomegaly with mild perihilar vascular congestion without frank pulmonary edema. 3. Aortic atherosclerosis. Electronically Signed   By: Jeannine Boga M.D.   On: 04/15/2017 04:12    Subjective: Seen and examined and was feeling better. No CP and SOB is much improved but still has a little cough. No nausea or vomiting and agreeable to SNF.  Discharge Exam: Vitals:   04/21/17 0900 04/21/17 1134  BP: (!) 106/57 99/64  Pulse:  72  Resp:    Temp:    SpO2:  92%   Vitals:   04/20/17 2033 04/21/17 0646 04/21/17 0900 04/21/17 1134  BP: (!) 111/56 119/61 (!) 106/57 99/64  Pulse: 74 64  72  Resp: 18 18    Temp: 97.6 F (36.4 C) 98 F (36.7 C)    TempSrc: Oral Oral    SpO2: 92% 94%  92%  Weight:  74.3 kg (163 lb 14.4 oz)    Height:       General: Pt is alert, awake, not in acute  distress Cardiovascular: RRR, S1/S2 +, no rubs, no gallops Respiratory: Diminished bilaterally  especially on right , no wheezing, no rhonchi Abdominal: Soft, NT, Distended due to body habitus; Has abdominal wall hernia, bowel sounds + Extremities: Trace edema, no cyanosis  The results of significant diagnostics from this hospitalization (including imaging, microbiology, ancillary and laboratory) are listed below for reference.    Microbiology: Recent Results (from the past 240 hour(s))  Respiratory Panel by PCR     Status: None   Collection Time: 04/15/17  3:47 AM  Result Value Ref Range Status   Adenovirus NOT DETECTED NOT DETECTED Final   Coronavirus 229E NOT DETECTED NOT DETECTED Final   Coronavirus HKU1 NOT DETECTED NOT DETECTED Final   Coronavirus NL63 NOT DETECTED NOT DETECTED Final   Coronavirus OC43 NOT DETECTED NOT DETECTED Final   Metapneumovirus NOT DETECTED NOT DETECTED Final   Rhinovirus / Enterovirus NOT DETECTED NOT DETECTED Final   Influenza A NOT DETECTED NOT DETECTED Final   Influenza B NOT DETECTED NOT DETECTED Final   Parainfluenza Virus 1 NOT DETECTED NOT DETECTED Final   Parainfluenza Virus 2 NOT DETECTED NOT DETECTED Final   Parainfluenza Virus 3 NOT DETECTED NOT DETECTED Final   Parainfluenza Virus 4 NOT DETECTED NOT DETECTED Final   Respiratory Syncytial Virus NOT DETECTED NOT DETECTED Final   Bordetella pertussis NOT DETECTED NOT DETECTED Final   Chlamydophila pneumoniae NOT DETECTED NOT DETECTED Final   Mycoplasma pneumoniae NOT DETECTED NOT DETECTED Final  MRSA PCR Screening     Status: None   Collection Time: 04/15/17  3:47 AM  Result Value Ref Range Status   MRSA by PCR NEGATIVE NEGATIVE Final    Comment:        The GeneXpert MRSA Assay (FDA approved for NASAL specimens only), is one component of a comprehensive MRSA colonization surveillance program. It is not intended to diagnose MRSA infection nor to guide or monitor treatment for MRSA  infections.   Culture, blood (routine x 2)     Status: None   Collection Time: 04/15/17  4:44 AM  Result Value Ref Range Status   Specimen Description BLOOD RIGHT HAND  Final   Special Requests IN PEDIATRIC BOTTLE Blood Culture adequate volume  Final   Culture NO GROWTH 5 DAYS  Final   Report Status 04/20/2017 FINAL  Final  Culture, blood (routine x 2)     Status: None   Collection Time: 04/15/17  4:46 AM  Result Value Ref Range Status   Specimen Description BLOOD RIGHT ARM  Final   Special Requests IN PEDIATRIC BOTTLE Blood Culture adequate volume  Final   Culture NO GROWTH 5 DAYS  Final   Report Status 04/20/2017 FINAL  Final  Culture, expectorated sputum-assessment     Status: None   Collection Time: 04/15/17  9:10 AM  Result Value Ref Range Status   Specimen Description Expect. Sput  Final   Special Requests NONE  Final   Sputum evaluation THIS SPECIMEN IS ACCEPTABLE FOR SPUTUM CULTURE  Final   Report Status 04/15/2017 FINAL  Final  Culture, respiratory (NON-Expectorated)     Status: None   Collection Time: 04/15/17  9:10 AM  Result Value Ref Range Status   Specimen Description Expect. Sput  Final   Special Requests NONE Reflexed from [id number]  Final   Gram Stain   Final    RARE WBC PRESENT,BOTH PMN AND MONONUCLEAR RARE GRAM POSITIVE COCCI    Culture FEW Consistent with normal respiratory flora.  Final   Report Status 04/17/2017 FINAL  Final  Culture, sputum-assessment  Status: None   Collection Time: 04/20/17  6:21 AM  Result Value Ref Range Status   Specimen Description SPUTUM  Final   Special Requests NONE  Final   Sputum evaluation   Final    Sputum specimen not acceptable for testing.  Please recollect.   Results Called to: Westly Pam, RN AT 0820 ON 04/20/17 BY C. JESSUP, MLT.    Report Status 04/20/2017 FINAL  Final    Labs: BNP (last 3 results) Recent Labs    04/15/17 1119  BNP 681.2*   Basic Metabolic Panel: Recent Labs  Lab 04/15/17 0408  04/16/17 0605 04/17/17 1121 04/19/17 0819 04/20/17 0545 04/21/17 1021  NA 136 134* 132* 136 138 139  K 4.3 3.7 4.1 3.4* 3.7 3.2*  CL 97* 98* 96* 100* 97* 100*  CO2 24 23 22  21* 24 23  GLUCOSE 161* 158* 275* 250* 206* 152*  BUN 63* 72* 66* 62* 64* 65*  CREATININE 2.68* 2.71* 2.44* 2.00* 2.19* 1.88*  CALCIUM 8.6* 8.4* 8.3* 8.0* 8.5* 8.5*  MG 2.2  --   --   --   --  2.4  PHOS 3.5  --   --   --   --  3.9   Liver Function Tests: Recent Labs  Lab 04/15/17 0408 04/16/17 0605 04/19/17 0819 04/21/17 1021  AST 37 27 38 41  ALT 22 20 21 23   ALKPHOS 129* 141* 168* 192*  BILITOT 4.5* 3.0*  3.2* 1.6* 1.8*  PROT 7.5 6.7 7.0 7.3  ALBUMIN 3.1* 2.9* 2.4* 2.4*   No results for input(s): LIPASE, AMYLASE in the last 168 hours. No results for input(s): AMMONIA in the last 168 hours. CBC: Recent Labs  Lab 04/15/17 0408 04/16/17 0605 04/17/17 0532 04/18/17 0651 04/21/17 0514  WBC 15.1* 11.0* 14.3* 11.9* 12.2*  NEUTROABS 11.8*  --   --   --   --   HGB 15.5 12.7* 13.3 13.1 14.4  HCT 45.4 37.4* 38.6* 38.6* 41.4  MCV 94.6 93.5 93.2 93.7 92.6  PLT 125* 96* 118* 131* 154   Cardiac Enzymes: Recent Labs  Lab 04/15/17 0822  TROPONINI 0.14*   BNP: Invalid input(s): POCBNP CBG: Recent Labs  Lab 04/20/17 1222 04/20/17 1626 04/20/17 2130 04/21/17 0731 04/21/17 1133  GLUCAP 304* 254* 138* 120* 165*   D-Dimer No results for input(s): DDIMER in the last 72 hours. Hgb A1c No results for input(s): HGBA1C in the last 72 hours. Lipid Profile No results for input(s): CHOL, HDL, LDLCALC, TRIG, CHOLHDL, LDLDIRECT in the last 72 hours. Thyroid function studies No results for input(s): TSH, T4TOTAL, T3FREE, THYROIDAB in the last 72 hours.  Invalid input(s): FREET3 Anemia work up No results for input(s): VITAMINB12, FOLATE, FERRITIN, TIBC, IRON, RETICCTPCT in the last 72 hours. Urinalysis    Component Value Date/Time   COLORURINE YELLOW 04/15/2017 0910   APPEARANCEUR CLEAR 04/15/2017  0910   LABSPEC 1.013 04/15/2017 0910   PHURINE 7.0 04/15/2017 0910   GLUCOSEU 50 (A) 04/15/2017 0910   HGBUR NEGATIVE 04/15/2017 0910   BILIRUBINUR NEGATIVE 04/15/2017 0910   KETONESUR NEGATIVE 04/15/2017 0910   PROTEINUR 30 (A) 04/15/2017 0910   UROBILINOGEN 0.2 01/05/2015 1648   NITRITE NEGATIVE 04/15/2017 0910   LEUKOCYTESUR NEGATIVE 04/15/2017 0910   Sepsis Labs Invalid input(s): PROCALCITONIN,  WBC,  LACTICIDVEN Microbiology Recent Results (from the past 240 hour(s))  Respiratory Panel by PCR     Status: None   Collection Time: 04/15/17  3:47 AM  Result Value Ref Range Status  Adenovirus NOT DETECTED NOT DETECTED Final   Coronavirus 229E NOT DETECTED NOT DETECTED Final   Coronavirus HKU1 NOT DETECTED NOT DETECTED Final   Coronavirus NL63 NOT DETECTED NOT DETECTED Final   Coronavirus OC43 NOT DETECTED NOT DETECTED Final   Metapneumovirus NOT DETECTED NOT DETECTED Final   Rhinovirus / Enterovirus NOT DETECTED NOT DETECTED Final   Influenza A NOT DETECTED NOT DETECTED Final   Influenza B NOT DETECTED NOT DETECTED Final   Parainfluenza Virus 1 NOT DETECTED NOT DETECTED Final   Parainfluenza Virus 2 NOT DETECTED NOT DETECTED Final   Parainfluenza Virus 3 NOT DETECTED NOT DETECTED Final   Parainfluenza Virus 4 NOT DETECTED NOT DETECTED Final   Respiratory Syncytial Virus NOT DETECTED NOT DETECTED Final   Bordetella pertussis NOT DETECTED NOT DETECTED Final   Chlamydophila pneumoniae NOT DETECTED NOT DETECTED Final   Mycoplasma pneumoniae NOT DETECTED NOT DETECTED Final  MRSA PCR Screening     Status: None   Collection Time: 04/15/17  3:47 AM  Result Value Ref Range Status   MRSA by PCR NEGATIVE NEGATIVE Final    Comment:        The GeneXpert MRSA Assay (FDA approved for NASAL specimens only), is one component of a comprehensive MRSA colonization surveillance program. It is not intended to diagnose MRSA infection nor to guide or monitor treatment for MRSA infections.    Culture, blood (routine x 2)     Status: None   Collection Time: 04/15/17  4:44 AM  Result Value Ref Range Status   Specimen Description BLOOD RIGHT HAND  Final   Special Requests IN PEDIATRIC BOTTLE Blood Culture adequate volume  Final   Culture NO GROWTH 5 DAYS  Final   Report Status 04/20/2017 FINAL  Final  Culture, blood (routine x 2)     Status: None   Collection Time: 04/15/17  4:46 AM  Result Value Ref Range Status   Specimen Description BLOOD RIGHT ARM  Final   Special Requests IN PEDIATRIC BOTTLE Blood Culture adequate volume  Final   Culture NO GROWTH 5 DAYS  Final   Report Status 04/20/2017 FINAL  Final  Culture, expectorated sputum-assessment     Status: None   Collection Time: 04/15/17  9:10 AM  Result Value Ref Range Status   Specimen Description Expect. Sput  Final   Special Requests NONE  Final   Sputum evaluation THIS SPECIMEN IS ACCEPTABLE FOR SPUTUM CULTURE  Final   Report Status 04/15/2017 FINAL  Final  Culture, respiratory (NON-Expectorated)     Status: None   Collection Time: 04/15/17  9:10 AM  Result Value Ref Range Status   Specimen Description Expect. Sput  Final   Special Requests NONE Reflexed from [id number]  Final   Gram Stain   Final    RARE WBC PRESENT,BOTH PMN AND MONONUCLEAR RARE GRAM POSITIVE COCCI    Culture FEW Consistent with normal respiratory flora.  Final   Report Status 04/17/2017 FINAL  Final  Culture, sputum-assessment     Status: None   Collection Time: 04/20/17  6:21 AM  Result Value Ref Range Status   Specimen Description SPUTUM  Final   Special Requests NONE  Final   Sputum evaluation   Final    Sputum specimen not acceptable for testing.  Please recollect.   Results Called to: Westly Pam, RN AT 0820 ON 04/20/17 BY C. JESSUP, MLT.    Report Status 04/20/2017 FINAL  Final   Time coordinating discharge: 35 minutes  SIGNED:  Kerney Elbe, DO [facility] 04/21/2017, 2:57 PM Pager [phone]  If 7PM-7AM, please  contact night-coverage [email or web address] Password [email or web address]

## 2017-04-21 NOTE — Progress Notes (Signed)
Pt discharged with EMS, to SNF [facility].   C/a/ox3, no complaints

## 2017-04-21 NOTE — Clinical Social Work Placement (Signed)
   CLINICAL SOCIAL WORK PLACEMENT  NOTE  Date:  04/21/2017  Patient Details  Name: [name] MRN: [id number] Date of Birth: Nov 29, 1940  Clinical Social Work is seeking post-discharge placement for this patient at the [place] level of care (*CSW will initial, date and re-position this form in  chart as items are completed):  Yes   Patient/family provided with [place] Work Department's list of facilities offering this level of care within the geographic area requested by the patient (or if unable, by the patient's family).  Yes   Patient/family informed of their freedom to choose among providers that offer the needed level of care, that participate in Medicare, Medicaid or managed care program needed by the patient, have an available bed and are willing to accept the patient.  Yes   Patient/family informed of [facility]'s ownership interest in [facility] and [facility], as well as of the fact that they are under no obligation to receive care at these facilities.  PASRR submitted to EDS on 04/17/17     PASRR number received on 04/17/17     Existing PASRR number confirmed on       FL2 transmitted to all facilities in geographic area requested by pt/family on 04/17/17     FL2 transmitted to all facilities within larger geographic area on       Patient informed that his/her managed care company has contracts with or will negotiate with certain facilities, including the following:        Yes   Patient/family informed of bed offers received.  [facility] bed at [facility]     Physician recommends and patient chooses bed at      Patient to be transferred to [facility] on 04/21/17.  Patient to be transferred to facility by [facility]     Patient family notified on 04/21/17 of transfer.  Name of family member notified:        PHYSICIAN       Additional Comment:     _______________________________________________ Candie Chroman, LCSW 04/21/2017, 3:13 PM

## 2017-04-21 NOTE — Progress Notes (Signed)
Occupational Therapy Treatment Patient Details Name: [name] MRN: [id number] DOB: [birth year] Today's Date: 04/21/2017    History of present illness pt is a 77 y/o male with pmh significant for CKD, ICM, CAD post CABG, multiple CVA's, recurrent DVT/PE's, OSA and AAA repair admitted as a lateral transfer from [facility] due to s/s of flu then development of PNA.   OT comments  Pt greatly progressed towards OT goals this session, able to perform transfers at min guard level, and sink level grooming at min guard level leaning against sink for balance. Pt continues to present with decreased activity tolerance and fatigues quickly requiring rest breaks, and energy conservation education (handout provided). Pt continues to require SNF level therapy at [place].   Follow Up Recommendations  SNF    Equipment Recommendations  Other (comment)(defer to next venue)    Recommendations for Other Services      Precautions / Restrictions Precautions Precautions: Fall Restrictions Weight Bearing Restrictions: No       Mobility Bed Mobility               General bed mobility comments: in chair on arrival  Transfers Overall transfer level: Needs assistance Equipment used: Rolling walker (2 wheeled) Transfers: Sit to/from Stand Sit to Stand: Min guard         General transfer comment: no physical assist for lifting, only required tactile cue to maintain forward weightshift during transfer    Balance Overall balance assessment: Needs assistance Sitting-balance support: Feet supported Sitting balance-Leahy Scale: Fair Sitting balance - Comments: Pt able to progress from min A to min guard   Standing balance support: During functional activity;Bilateral upper extremity supported Standing balance-Leahy Scale: Poor Standing balance comment: relies on UE support or leaning against sink                           ADL either performed or assessed with clinical judgement   ADL  Overall ADL's : Needs assistance/impaired     Grooming: Wash/dry hands;Wash/dry face;Min guard;Standing Grooming Details (indicate cue type and reason): sink level, unable to complete oral care due to fatigue                 Toilet Transfer: Min guard;Ambulation;RW Toilet Transfer Details (indicate cue type and reason): min guard for safety Toileting- Clothing Manipulation and Hygiene: Min guard;Sit to/from stand Toileting - Clothing Manipulation Details (indicate cue type and reason): standing (urination only)       General ADL Comments: Pt fatigued quickly, provided handout on energy conservation     Vision       Perception     Praxis      Cognition Arousal/Alertness: Awake/alert Behavior During Therapy: WFL for tasks assessed/performed Overall Cognitive Status: Within Functional Limits for tasks assessed                                          Exercises     Shoulder Instructions       General Comments VSS    Pertinent Vitals/ Pain       Pain Assessment: No/denies pain Pain Intervention(s): Monitored during session  Home Living  Prior Functioning/Environment              Frequency  Min 2X/week        Progress Toward Goals  OT Goals(current goals can now be found in the care plan section)  Progress towards OT goals: Progressing toward goals  Acute Rehab OT Goals Patient Stated Goal: get well and go home OT Goal Formulation: With patient Time For Goal Achievement: 05/01/17 Potential to Achieve Goals: Good  Plan Discharge plan remains appropriate;Frequency remains appropriate    Co-evaluation                 AM-PAC PT "6 Clicks" Daily Activity     Outcome Measure   Help from another person eating meals?: None Help from another person taking care of personal grooming?: A Little Help from another person toileting, which includes using toliet, bedpan, or  urinal?: A Little Help from another person bathing (including washing, rinsing, drying)?: A Lot Help from another person to put on and taking off regular upper body clothing?: A Little Help from another person to put on and taking off regular lower body clothing?: A Lot 6 Click Score: 17    End of Session    OT Visit Diagnosis: Unsteadiness on feet (R26.81);Other abnormalities of gait and mobility (R26.89);Muscle weakness (generalized) (M62.81)   Activity Tolerance     Patient Left     Nurse Communication          Time: 3143-8887 OT Time Calculation (min): 15 min  Charges: OT General Charges $OT Visit: 1 Visit OT Treatments $Self Care/Home Management : 8-22 mins  Hulda Humphrey OTR/L [place] 04/21/2017, 5:16 PM

## 2017-04-21 NOTE — Clinical Social Work Note (Signed)
CSW facilitated patient discharge including contacting patient family and facility to confirm patient discharge plans. Clinical information faxed to facility and family agreeable with plan. CSW arranged ambulance transport via [facility] to [place]. RN to call report prior to discharge [phone]).  CSW will sign off for now as social work intervention is no longer needed. Please consult [place] again if new needs arise.  [place], [place]

## 2017-04-21 NOTE — Progress Notes (Signed)
ANTICOAGULATION CONSULT NOTE - Follow Up Consult  Pharmacy Consult for warfarin Indication: hx DVT/PEs  Allergies  Allergen Reactions  . Aspirin Anaphylaxis  . Ivp Dye [Iodinated Diagnostic Agents] Anaphylaxis    Blackout  . Heparin Other (See Comments)    Patient does not know what problem was, it occurred at [facility], but this is not in [place] Patient does not know what problem was, it occurred at [facility], but this is not in [place]     Patient Measurements: Height: 5\' 7"  (170.2 cm) Weight: 163 lb 14.4 oz (74.3 kg) IBW/kg (Calculated) : 66.1 Heparin Dosing Weight: 77.5 kg  Vital Signs: Temp: 98 F (36.7 C) (01/30 0646) Temp Source: Oral (01/30 0646) BP: 99/64 (01/30 1134) Pulse Rate: 72 (01/30 1134)  Labs: Recent Labs    04/19/17 0819 04/20/17 0545 04/21/17 0514 04/21/17 0719 04/21/17 1021  HGB  --   --  14.4  --   --   HCT  --   --  41.4  --   --   PLT  --   --  154  --   --   LABPROT 24.2* 22.4*  --  26.4*  --   INR 2.19 1.99  --  2.45  --   CREATININE 2.00* 2.19*  --   --  1.88*    Estimated Creatinine Clearance: 30.8 mL/min (A) (by C-G formula based on SCr of 1.88 mg/dL (H)).  Assessment: 77 yo M continues on warfarin for hx multiple DVTs and PEs.  Home regimen is 5 mg and 2.5 mg alternating, confirmed with patient.  INR within range today at 2.45. CBC stable, no bleeding noted  Goal of Therapy:  INR 2-3 Monitor platelets by anticoagulation protocol: Yes   Plan:  Warfarin 2.5mg  tonight Daily INR Follow for s/s bleeding  [name] D. Kathey Simer, PharmD, BCPS Clinical Pharmacist Clinical Phone for 04/21/2017 until 3:30pm: [phone] If after 3:30pm, please call main pharmacy at [phone] 04/21/2017 12:07 PM

## 2017-05-21 DEATH — deceased

## 2019-05-30 IMAGING — DX DG CHEST 1V PORT
1 series · 1 of 1 positions shown · non-contrast
Comparison: Prior radiograph from 02/24/2017.

CLINICAL DATA: Initial evaluation for acute hypoxia.

EXAM:
PORTABLE CHEST 1 VIEW

[chest ap]
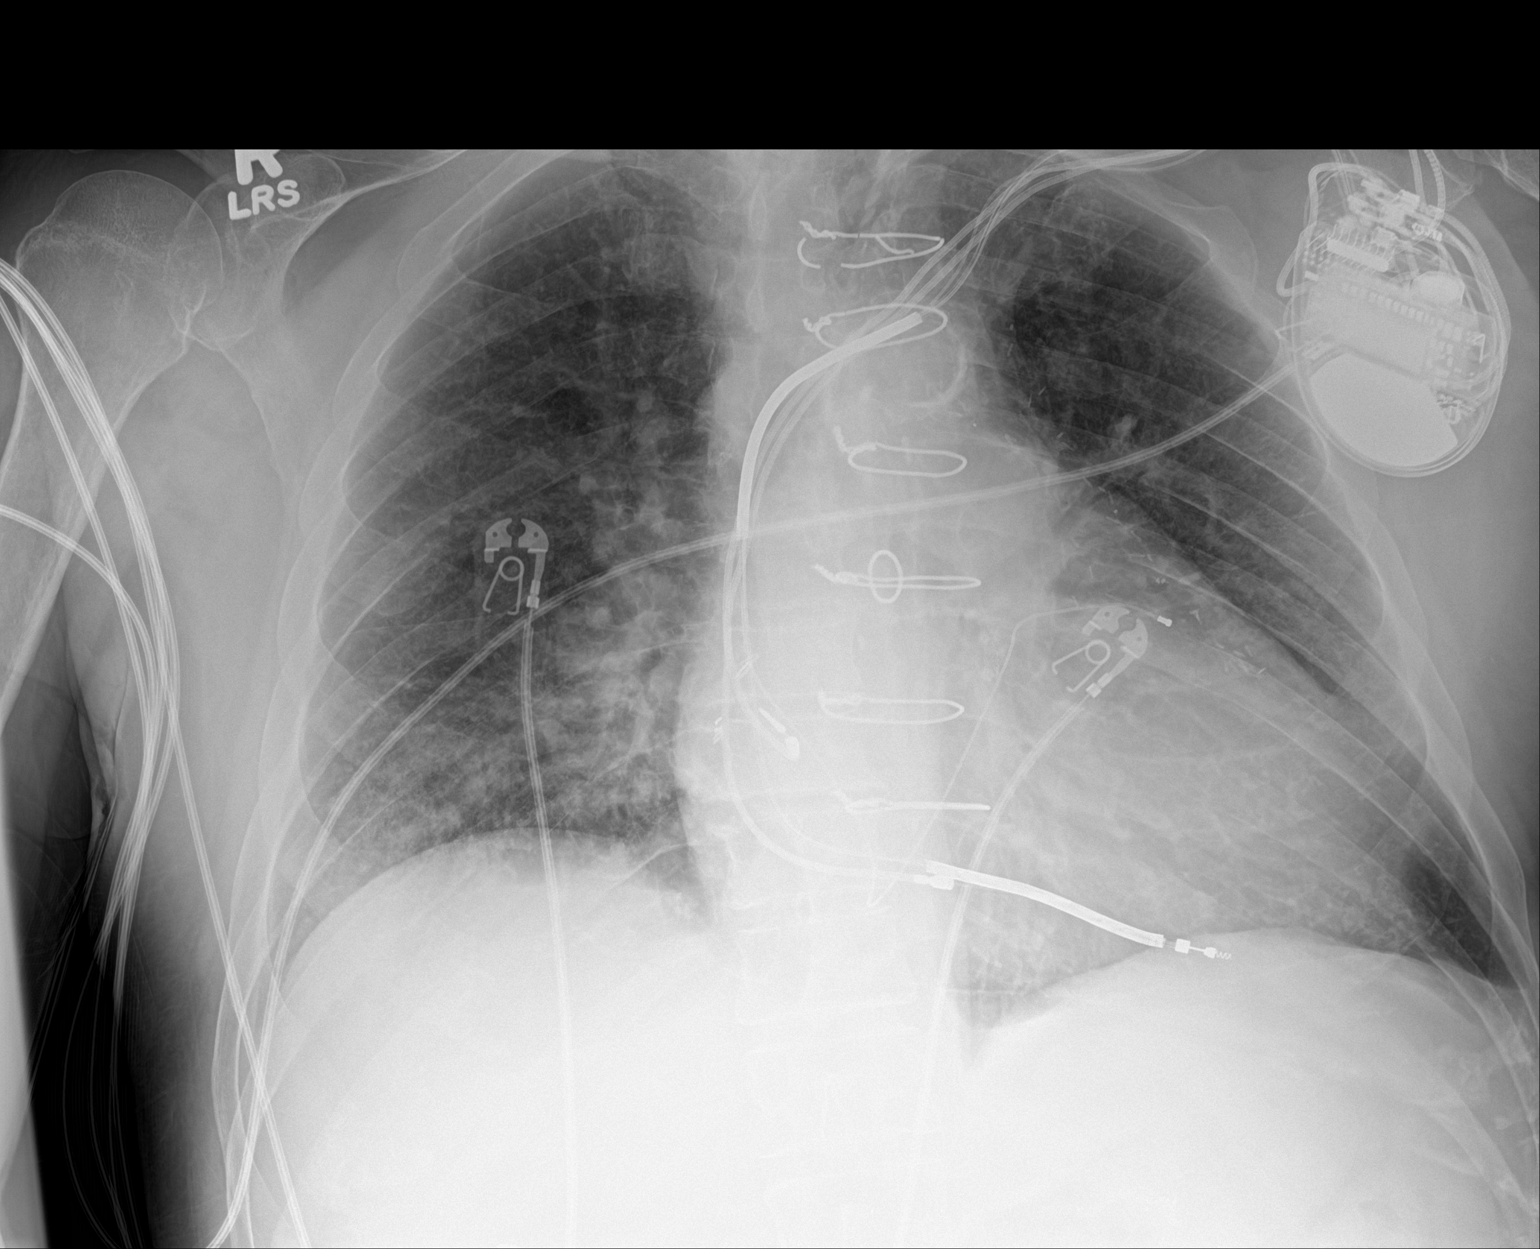

[1 of 1 positions shown; findings below may reference images not displayed]

FINDINGS: Median sternotomy wires with underlying CABG markers and surgical
clips noted. Moderate cardiomegaly, similar to previous. Left-sided
pacemaker/AICD in place. Aortic atherosclerosis.

Lungs normally inflated. Patchy multifocal opacity within the right
lung base, suspicious for possible infiltrate. Sequelae of
aspiration could also be considered. Perihilar vascular congestion
without pulmonary edema. No definite pleural effusion. No
pneumothorax.

No acute osseous abnormality.
IMPRESSION: 1. Patchy right basilar opacity, suspicious for possible infiltrate.
Sequelae of aspiration could also be considered.
2. Cardiomegaly with mild perihilar vascular congestion without
frank pulmonary edema.
3. Aortic atherosclerosis.

## 2019-05-30 IMAGING — DX DG CHEST 1V PORT
1 series · 1 of 1 positions shown · non-contrast
Comparison: 04/15/2017

CLINICAL DATA: Chest pain

EXAM:
PORTABLE CHEST 1 VIEW

[chest ap]
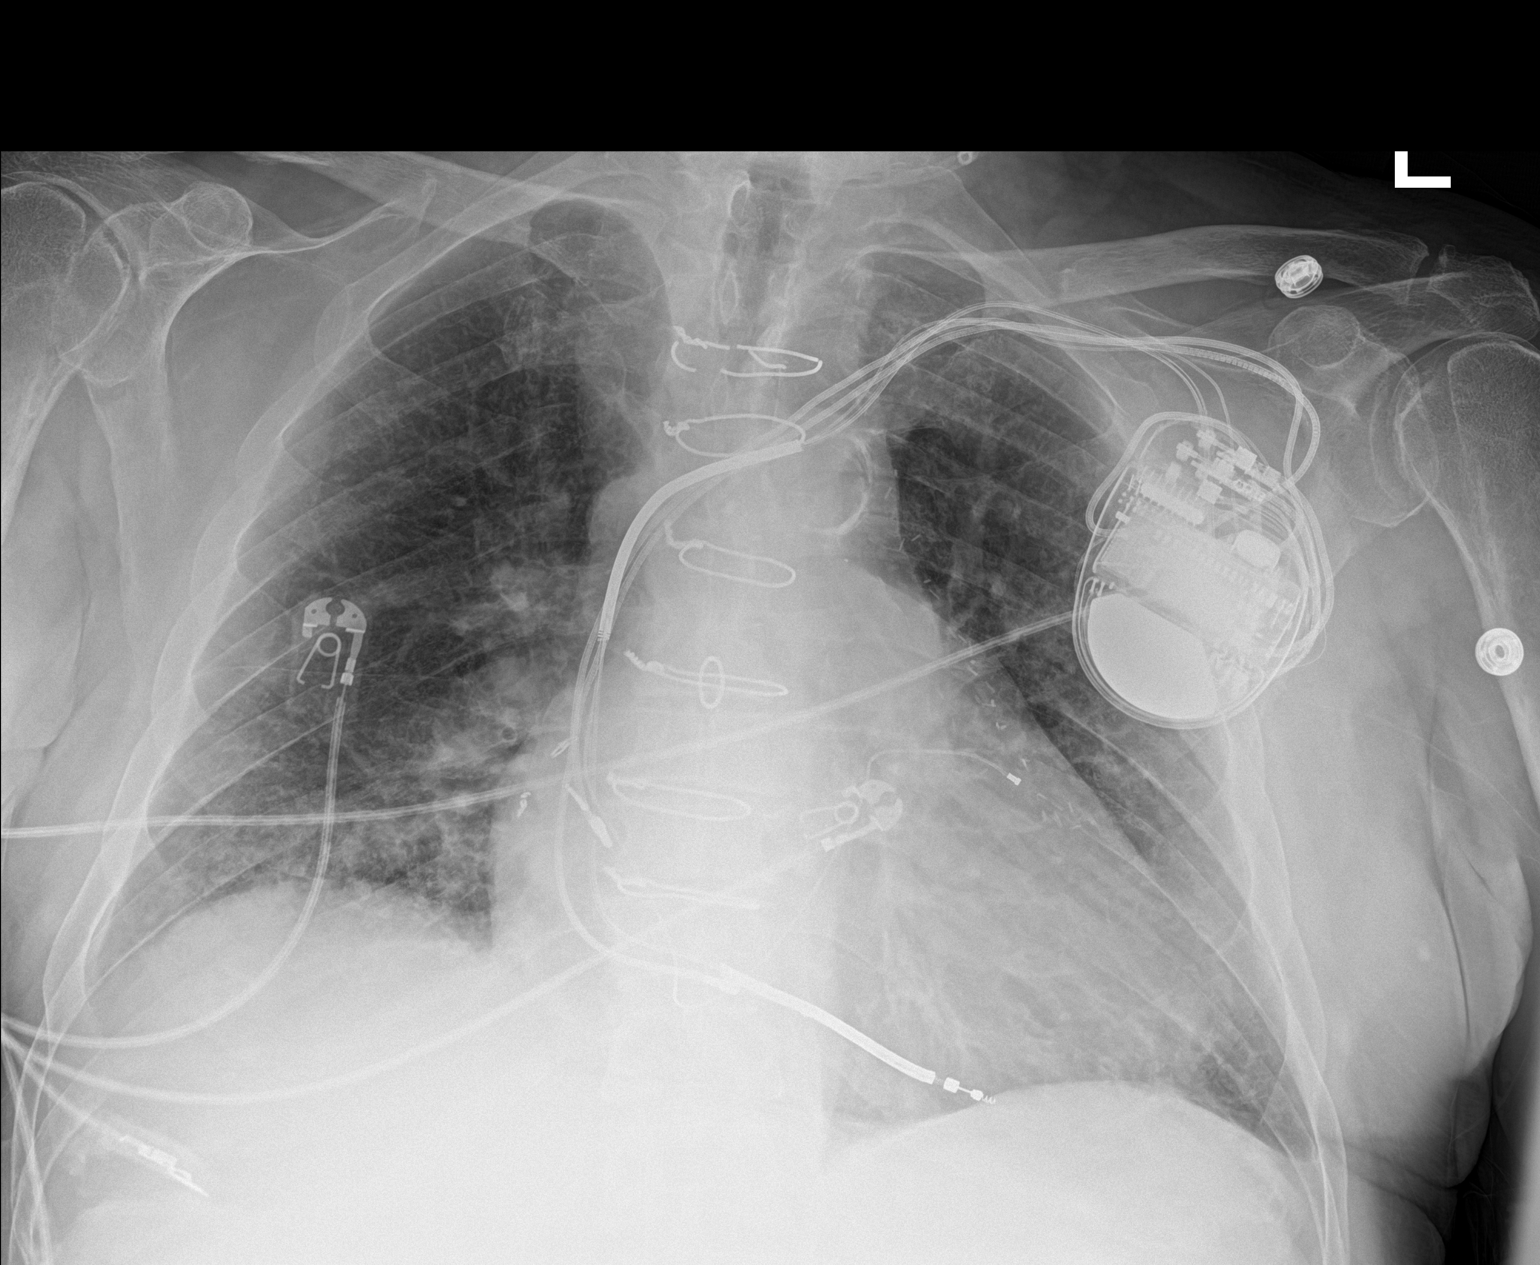

[1 of 1 positions shown; findings below may reference images not displayed]

FINDINGS: 2234 hours. The cardio pericardial silhouette is enlarged.
Interstitial markings are diffusely coarsened with chronic features.
Similar appearance of the patchy bibasilar airspace opacity, right
greater than left. Permanent pacer/AICD again noted. The visualized
bony structures of the thorax are intact. Telemetry leads overlie
the chest.
IMPRESSION: 1. Stable exam with cardiomegaly and diffuse chronic interstitial
changes.
2. Patchy basilar airspace disease bilaterally, right greater than
left is similar to prior study.
